# Patient Record
Sex: Male | Born: 1955 | Race: White | Hispanic: No | Marital: Single | Smoking: Former smoker
Health system: Southern US, Community
[De-identification: ages and names within clinical notes are randomized; demographics above are authoritative.]

## PROBLEM LIST (undated history)

## (undated) DIAGNOSIS — I4892 Unspecified atrial flutter: Secondary | ICD-10-CM

## (undated) DIAGNOSIS — E785 Hyperlipidemia, unspecified: Secondary | ICD-10-CM

## (undated) DIAGNOSIS — I2510 Atherosclerotic heart disease of native coronary artery without angina pectoris: Secondary | ICD-10-CM

## (undated) DIAGNOSIS — C801 Malignant (primary) neoplasm, unspecified: Secondary | ICD-10-CM

## (undated) HISTORY — PX: GUM SURGERY: SHX658

---

## 2010-11-16 HISTORY — PX: COLONOSCOPY: SHX174

## 2010-11-16 HISTORY — PX: HERNIA REPAIR: SHX51

## 2011-10-02 ENCOUNTER — Ambulatory Visit: Payer: Self-pay | Admitting: General Surgery

## 2011-10-07 LAB — PATHOLOGY REPORT

## 2011-10-28 ENCOUNTER — Ambulatory Visit: Payer: Self-pay | Admitting: General Surgery

## 2013-06-01 ENCOUNTER — Encounter: Payer: Self-pay | Admitting: *Deleted

## 2017-03-01 ENCOUNTER — Other Ambulatory Visit: Payer: Self-pay | Admitting: Urology

## 2017-03-01 DIAGNOSIS — C61 Malignant neoplasm of prostate: Secondary | ICD-10-CM

## 2017-03-09 ENCOUNTER — Ambulatory Visit
Admission: RE | Admit: 2017-03-09 | Discharge: 2017-03-09 | Disposition: A | Discharge status: Home / Self Care | Payer: BLUE CROSS/BLUE SHIELD | Source: Ambulatory Visit | Attending: Urology | Admitting: Urology

## 2017-03-09 DIAGNOSIS — R937 Abnormal findings on diagnostic imaging of other parts of musculoskeletal system: Secondary | ICD-10-CM | POA: Diagnosis not present

## 2017-03-09 DIAGNOSIS — C61 Malignant neoplasm of prostate: Secondary | ICD-10-CM | POA: Diagnosis not present

## 2017-03-09 DIAGNOSIS — K760 Fatty (change of) liver, not elsewhere classified: Secondary | ICD-10-CM | POA: Diagnosis not present

## 2017-03-09 DIAGNOSIS — K8020 Calculus of gallbladder without cholecystitis without obstruction: Secondary | ICD-10-CM | POA: Diagnosis not present

## 2017-03-09 DIAGNOSIS — I700 Atherosclerosis of aorta: Secondary | ICD-10-CM | POA: Insufficient documentation

## 2017-03-09 DIAGNOSIS — K5730 Diverticulosis of large intestine without perforation or abscess without bleeding: Secondary | ICD-10-CM | POA: Diagnosis not present

## 2017-03-09 MED ORDER — IOPAMIDOL (ISOVUE-300) INJECTION 61%
100.0000 mL | Freq: Once | INTRAVENOUS | Status: AC | PRN
  Administered 2017-03-09: 100 mL via INTRAVENOUS

## 2017-03-09 MED ORDER — TECHNETIUM TC 99M MEDRONATE IV KIT
25.0000 | PACK | Freq: Once | INTRAVENOUS | Status: AC | PRN
  Administered 2017-03-09: 23.96 via INTRAVENOUS

## 2017-03-16 ENCOUNTER — Other Ambulatory Visit: Payer: Self-pay | Admitting: Oncology

## 2017-08-17 ENCOUNTER — Emergency Department: Payer: BLUE CROSS/BLUE SHIELD

## 2017-08-17 ENCOUNTER — Inpatient Hospital Stay
Admission: EM | Admit: 2017-08-17 | Discharge: 2017-08-19 | DRG: 494 | Disposition: A | Discharge status: Home / Self Care | Payer: BLUE CROSS/BLUE SHIELD | Attending: Specialist | Admitting: Specialist

## 2017-08-17 ENCOUNTER — Encounter: Payer: Self-pay | Admitting: Emergency Medicine

## 2017-08-17 DIAGNOSIS — Z923 Personal history of irradiation: Secondary | ICD-10-CM

## 2017-08-17 DIAGNOSIS — Z87891 Personal history of nicotine dependence: Secondary | ICD-10-CM

## 2017-08-17 DIAGNOSIS — S82892A Other fracture of left lower leg, initial encounter for closed fracture: Secondary | ICD-10-CM | POA: Diagnosis not present

## 2017-08-17 DIAGNOSIS — W010XXA Fall on same level from slipping, tripping and stumbling without subsequent striking against object, initial encounter: Secondary | ICD-10-CM | POA: Diagnosis present

## 2017-08-17 DIAGNOSIS — Z791 Long term (current) use of non-steroidal anti-inflammatories (NSAID): Secondary | ICD-10-CM

## 2017-08-17 DIAGNOSIS — C61 Malignant neoplasm of prostate: Secondary | ICD-10-CM | POA: Diagnosis present

## 2017-08-17 DIAGNOSIS — Y92007 Garden or yard of unspecified non-institutional (private) residence as the place of occurrence of the external cause: Secondary | ICD-10-CM

## 2017-08-17 DIAGNOSIS — S82842A Displaced bimalleolar fracture of left lower leg, initial encounter for closed fracture: Principal | ICD-10-CM | POA: Diagnosis present

## 2017-08-17 DIAGNOSIS — Y9301 Activity, walking, marching and hiking: Secondary | ICD-10-CM | POA: Diagnosis present

## 2017-08-17 DIAGNOSIS — S82852A Displaced trimalleolar fracture of left lower leg, initial encounter for closed fracture: Secondary | ICD-10-CM

## 2017-08-17 DIAGNOSIS — Z833 Family history of diabetes mellitus: Secondary | ICD-10-CM

## 2017-08-17 DIAGNOSIS — Z79899 Other long term (current) drug therapy: Secondary | ICD-10-CM

## 2017-08-17 HISTORY — DX: Malignant (primary) neoplasm, unspecified: C80.1

## 2017-08-17 LAB — TYPE AND SCREEN
ABO/RH(D): A POS
Antibody Screen: NEGATIVE

## 2017-08-17 MED ORDER — MORPHINE SULFATE (PF) 4 MG/ML IV SOLN
4.0000 mg | Freq: Once | INTRAVENOUS | Status: AC
  Administered 2017-08-17: 4 mg via INTRAVENOUS

## 2017-08-17 MED ORDER — ONDANSETRON HCL 4 MG/2ML IJ SOLN
4.0000 mg | Freq: Once | INTRAMUSCULAR | Status: AC
  Administered 2017-08-17: 4 mg via INTRAVENOUS

## 2017-08-17 MED ORDER — FENTANYL CITRATE (PF) 100 MCG/2ML IJ SOLN
50.0000 ug | INTRAMUSCULAR | Status: DC | PRN
  Administered 2017-08-18 (×2): 50 ug via INTRAVENOUS
  Filled 2017-08-17 (×3): qty 2

## 2017-08-17 MED ORDER — MORPHINE SULFATE (PF) 4 MG/ML IV SOLN
INTRAVENOUS | Status: AC
  Administered 2017-08-17: 4 mg via INTRAVENOUS
  Filled 2017-08-17: qty 1

## 2017-08-17 MED ORDER — ONDANSETRON HCL 4 MG/2ML IJ SOLN
INTRAMUSCULAR | Status: AC
  Administered 2017-08-17: 4 mg via INTRAVENOUS
  Filled 2017-08-17: qty 2

## 2017-08-17 NOTE — ED Notes (Signed)
ED Provider at bedside. 

## 2017-08-17 NOTE — ED Provider Notes (Addendum)
Patient received in sign-out from Dr. Virgie Dad.  Workup and evaluation pending consultation evaluation and imaging by orthopedics. I spoke with Dr. Sabra Heck of orthopedics who is recommending operative fixation with admission to hospital. Patient will be splinted and admitted the hospital. Remains dynamically stable. Dr. Sabra Heck has requested hospitalist consultation.Marland Kitchen  Marland KitchenSplint Application Date/Time: 61/04/736 11:45 PM Performed by: Merlyn Lot Authorized by: Merlyn Lot   Consent:    Consent obtained:  Verbal   Consent given by:  Patient Procedure details:    Laterality:  Left   Location:  Ankle   Ankle:  L ankle   Splint type:  Short leg   Supplies:  Ortho-Glass Post-procedure details:    Pain:  Unchanged   Patient tolerance of procedure:  Tolerated well, no immediate complications       Merlyn Lot, MD 08/17/17 2344    Merlyn Lot, MD 08/17/17 [phone]

## 2017-08-17 NOTE — ED Triage Notes (Signed)
Patient to ER after slipping on wet grass. States right foot slipped, left foot bent backwards (toes flexed towards body) and patient fell. Patient has large amount of swelling and some deformity to left ankle.

## 2017-08-17 NOTE — ED Provider Notes (Signed)
[facility] Emergency Department Provider Note  Time seen: 10:54 PM  I have reviewed the triage vital signs and the nursing notes.   HISTORY  Chief Complaint Ankle Injury    HPI Stephen Sherman is a 61 y.o. male with a past medical history prostate cancer who presents to the emergency department with left ankle pain. According to the patient he was walking down a slope in his front yard he slipped falling on top of his left leg/ankle. States immediate pain to the ankle unable to bear weight on the leg. Denies any other injuries. Denies hitting his head. Denies LOC. States his pain is moderate but does not wish for any pain medication at this time.  Past Medical History:  Diagnosis Date  . Cancer [facility])    Prostate    There are no active problems to display for this patient.   Past Surgical History:  Procedure Laterality Date  . COLONOSCOPY  2012  . GUM SURGERY  [phone]  . HERNIA REPAIR Bilateral 2012   INGUNIAL    Prior to Admission medications   Medication Sig Start Date End Date Taking? Authorizing Provider  bicalutamide (CASODEX) 50 MG tablet Take 50 mg by mouth daily. 08/10/17  Yes [provider]  ibuprofen (ADVIL,MOTRIN) 200 MG tablet Take 200 mg by mouth every 6 (six) hours as needed.   Yes [provider]  leuprolide (LUPRON) 22.5 MG injection Inject 22.5 mg into the muscle every 3 (three) months.   Yes [provider]  levofloxacin (LEVAQUIN) 500 MG tablet Take 500 mg by mouth daily. 08/09/17  Yes [provider]  tamsulosin (FLOMAX) 0.4 MG CAPS capsule Take 0.4 mg by mouth daily.   Yes [provider]  vitamin C (ASCORBIC ACID) 500 MG tablet Take 500 mg by mouth daily.   Yes [provider]  vitamin E 400 UNIT capsule Take 400 Units by mouth daily.   Yes [provider]    No Known Allergies  No family history on file.  Social History Social History  Substance Use Topics  .  Smoking status: Former Smoker    Packs/day: 1.00    Years: 28.00  . Smokeless tobacco: Never Used  . Alcohol use Yes    Review of Systems Constitutional: Negative for fever.no head injury or LOC. Cardiovascular: Negative for chest pain. Respiratory: Negative for shortness of breath. Gastrointestinal: Negative for abdominal pain. Musculoskeletal: left ankle pain and swelling Neurological: Negative for headache All other ROS negative  ____________________________________________   PHYSICAL EXAM:  VITAL SIGNS: ED Triage Vitals  Enc Vitals Group     BP 08/17/17 2201 120/76     Pulse Rate 08/17/17 2201 78     Resp 08/17/17 2201 20     Temp 08/17/17 2201 98.9 F (37.2 C)     Temp Source 08/17/17 2201 Oral     SpO2 08/17/17 2201 96 %     Weight 08/17/17 2202 272 lb (123.4 kg)     Height 08/17/17 2202 5' 10.5" (1.791 m)     Head Circumference --      Peak Flow --      Pain Score --      Pain Loc --      Pain Edu? --      Excl. in [place]? --     Constitutional: Alert and oriented. Well appearing and in no distress. Eyes: Normal exam ENT   Head: Normocephalic and atraumatic   Mouth/Throat: Mucous membranes are moist.  Cardiovascular: Normal rate, regular rhythm. No murmur Respiratory: Normal respiratory effort without tachypnea nor retractions. Breath sounds are clear  Gastrointestinal: Soft and nontender. No distention.   Musculoskeletal: patient with moderate swelling and deformity to left ankle. Neurovascularly intact distally with 2+ DP pulse normal sensation. Neurologic:  Normal speech and language. No gross focal neurologic deficits  Skin:  Skin is warm, dry and intact.  Psychiatric: Mood and affect are normal.   ____________________________________________   RADIOLOGY   IMPRESSION: Left ankle fractures are better demonstrated on left ankle views. See separate report. Degenerative changes in the intertarsal joints. Focal sclerosis in the inferior posterior  calcaneus is nonspecific but could indicate a nondisplaced impaction fracture.  IMPRESSION: Displaced fracture of the medial and posterior malleolus as well as displaced oblique fracture of the distal fibular diametaphyseal region. Mild anterior subluxation of the tibia on the talus.  ____________________________________________   INITIAL IMPRESSION / ASSESSMENT AND PLAN / ED COURSE  Pertinent labs & imaging results that were available during my care of the patient were reviewed by me and considered in my medical decision making (see chart for details).  patient presents to the emergency department after a fall with left ankle injury. Differential this time would include fracture, dislocation, sprain. X-ray consistent with fracture of medial and posterior malleolus as well as distal fibula fracture. I discussed the patient with Dr. Sabra Heck who will be reviewing the x-rays to decide upon disposition for the patient. Overall the patient appears well, no distress, lying in bed comfortably has long-standing ankle was not being moved. Does not wish for any pain control this time.care everywhere chart reviewed, patient oncology care is currently at [facility].  ____________________________________________   FINAL CLINICAL IMPRESSION(S) / ED DIAGNOSES  ankle fracture    Harvest Dark, MD 08/20/17 7482

## 2017-08-18 ENCOUNTER — Encounter
Admission: EM | Disposition: A | Discharge status: Home / Self Care | Payer: Self-pay | Source: Home / Self Care | Attending: Specialist

## 2017-08-18 ENCOUNTER — Inpatient Hospital Stay: Payer: BLUE CROSS/BLUE SHIELD | Admitting: Certified Registered"

## 2017-08-18 ENCOUNTER — Encounter: Payer: Self-pay | Admitting: Internal Medicine

## 2017-08-18 DIAGNOSIS — Z87891 Personal history of nicotine dependence: Secondary | ICD-10-CM | POA: Diagnosis not present

## 2017-08-18 DIAGNOSIS — Z833 Family history of diabetes mellitus: Secondary | ICD-10-CM | POA: Diagnosis not present

## 2017-08-18 DIAGNOSIS — S82892A Other fracture of left lower leg, initial encounter for closed fracture: Secondary | ICD-10-CM | POA: Diagnosis present

## 2017-08-18 DIAGNOSIS — C61 Malignant neoplasm of prostate: Secondary | ICD-10-CM | POA: Diagnosis present

## 2017-08-18 DIAGNOSIS — Y9301 Activity, walking, marching and hiking: Secondary | ICD-10-CM | POA: Diagnosis present

## 2017-08-18 DIAGNOSIS — S82842A Displaced bimalleolar fracture of left lower leg, initial encounter for closed fracture: Secondary | ICD-10-CM | POA: Diagnosis present

## 2017-08-18 DIAGNOSIS — W010XXA Fall on same level from slipping, tripping and stumbling without subsequent striking against object, initial encounter: Secondary | ICD-10-CM | POA: Diagnosis present

## 2017-08-18 DIAGNOSIS — Z791 Long term (current) use of non-steroidal anti-inflammatories (NSAID): Secondary | ICD-10-CM | POA: Diagnosis not present

## 2017-08-18 DIAGNOSIS — Z79899 Other long term (current) drug therapy: Secondary | ICD-10-CM | POA: Diagnosis not present

## 2017-08-18 DIAGNOSIS — Z923 Personal history of irradiation: Secondary | ICD-10-CM | POA: Diagnosis not present

## 2017-08-18 DIAGNOSIS — Y92007 Garden or yard of unspecified non-institutional (private) residence as the place of occurrence of the external cause: Secondary | ICD-10-CM | POA: Diagnosis not present

## 2017-08-18 HISTORY — PX: ORIF ANKLE FRACTURE: SHX5408

## 2017-08-18 LAB — CBC WITH DIFFERENTIAL/PLATELET
Basophils Absolute: 0 10*3/uL (ref 0–0.1)
Basophils Relative: 0 %
Eosinophils Absolute: 0.1 10*3/uL (ref 0–0.7)
Eosinophils Relative: 2 %
HCT: 38.7 % — ABNORMAL LOW (ref 40.0–52.0)
Hemoglobin: 13.4 g/dL (ref 13.0–18.0)
Lymphocytes Relative: 17 %
Lymphs Abs: 1.5 10*3/uL (ref 1.0–3.6)
MCH: 30.3 pg (ref 26.0–34.0)
MCHC: 34.6 g/dL (ref 32.0–36.0)
MCV: 87.6 fL (ref 80.0–100.0)
Monocytes Absolute: 0.7 10*3/uL (ref 0.2–1.0)
Monocytes Relative: 8 %
Neutro Abs: 6.3 10*3/uL (ref 1.4–6.5)
Neutrophils Relative %: 73 %
Platelets: 164 10*3/uL (ref 150–440)
RBC: 4.42 MIL/uL (ref 4.40–5.90)
RDW: 14.8 % — ABNORMAL HIGH (ref 11.5–14.5)
WBC: 8.6 10*3/uL (ref 3.8–10.6)

## 2017-08-18 LAB — COMPREHENSIVE METABOLIC PANEL
ALT: 51 U/L (ref 17–63)
AST: 51 U/L — ABNORMAL HIGH (ref 15–41)
Albumin: 3.8 g/dL (ref 3.5–5.0)
Alkaline Phosphatase: 45 U/L (ref 38–126)
Anion gap: 8 (ref 5–15)
BUN: 19 mg/dL (ref 6–20)
CO2: 26 mmol/L (ref 22–32)
Calcium: 8.7 mg/dL — ABNORMAL LOW (ref 8.9–10.3)
Chloride: 103 mmol/L (ref 101–111)
Creatinine, Ser: 0.64 mg/dL (ref 0.61–1.24)
GFR calc Af Amer: >60 mL/min (ref >60)
GFR calc non Af Amer: >60 mL/min (ref >60)
Glucose, Bld: 118 mg/dL — ABNORMAL HIGH (ref 65–99)
Potassium: 4.1 mmol/L (ref 3.5–5.1)
Sodium: 137 mmol/L (ref 135–145)
Total Bilirubin: 0.8 mg/dL (ref 0.3–1.2)
Total Protein: 7.1 g/dL (ref 6.5–8.1)

## 2017-08-18 LAB — URINALYSIS, ROUTINE W REFLEX MICROSCOPIC
Bilirubin Urine: NEGATIVE
Glucose, UA: NEGATIVE mg/dL
Hgb urine dipstick: NEGATIVE
Ketones, ur: NEGATIVE mg/dL
Leukocytes, UA: NEGATIVE
Nitrite: NEGATIVE
Protein, ur: NEGATIVE mg/dL
Specific Gravity, Urine: 1.023 (ref 1.005–1.030)
WBC, UA: NONE SEEN WBC/hpf (ref 0–5)
pH: 5 (ref 5.0–8.0)

## 2017-08-18 LAB — MRSA PCR SCREENING: MRSA by PCR: NEGATIVE

## 2017-08-18 LAB — APTT: aPTT: 28 seconds (ref 24–36)

## 2017-08-18 LAB — PROTIME-INR
INR: 1.05
Prothrombin Time: 13.6 seconds (ref 11.4–15.2)

## 2017-08-18 SURGERY — OPEN REDUCTION INTERNAL FIXATION (ORIF) ANKLE FRACTURE
Anesthesia: Spinal | Site: Ankle | Laterality: Left | Wound class: Clean

## 2017-08-18 MED ORDER — BUPIVACAINE HCL (PF) 0.5 % IJ SOLN
INTRAMUSCULAR | Status: AC
  Filled 2017-08-18: qty 30

## 2017-08-18 MED ORDER — BUPIVACAINE HCL 0.5 % IJ SOLN
INTRAMUSCULAR | Status: DC | PRN
  Administered 2017-08-18: 30 mL

## 2017-08-18 MED ORDER — BICALUTAMIDE 50 MG PO TABS
50.0000 mg | ORAL_TABLET | Freq: Every day | ORAL | Status: DC
  Filled 2017-08-18: qty 1

## 2017-08-18 MED ORDER — DEXTROSE 5 % IV SOLN
500.0000 mg | Freq: Four times a day (QID) | INTRAVENOUS | Status: DC | PRN
  Filled 2017-08-18: qty 5

## 2017-08-18 MED ORDER — CALCIUM CARBONATE ANTACID 500 MG PO CHEW
1.0000 | CHEWABLE_TABLET | Freq: Two times a day (BID) | ORAL | Status: DC
  Administered 2017-08-18 – 2017-08-19 (×2): 200 mg via ORAL
  Filled 2017-08-18 (×2): qty 1

## 2017-08-18 MED ORDER — CLINDAMYCIN PHOSPHATE 600 MG/50ML IV SOLN
INTRAVENOUS | Status: DC | PRN
  Administered 2017-08-18: 600 mg via INTRAVENOUS

## 2017-08-18 MED ORDER — EPINEPHRINE PF 1 MG/ML IJ SOLN
INTRAMUSCULAR | Status: AC
  Filled 2017-08-18: qty 1

## 2017-08-18 MED ORDER — BUPIVACAINE HCL (PF) 0.5 % IJ SOLN
INTRAMUSCULAR | Status: DC | PRN
  Administered 2017-08-18: 3 mL via INTRATHECAL

## 2017-08-18 MED ORDER — FENTANYL CITRATE (PF) 100 MCG/2ML IJ SOLN: 25.0000 ug | INTRAMUSCULAR | Status: DC | PRN

## 2017-08-18 MED ORDER — SODIUM CHLORIDE 0.9 % IV SOLN
INTRAVENOUS | Status: DC
  Administered 2017-08-18 (×2): 75 mL/h via INTRAVENOUS

## 2017-08-18 MED ORDER — CHLORHEXIDINE GLUCONATE 4 % EX LIQD
60.0000 mL | Freq: Once | CUTANEOUS | Status: AC
  Administered 2017-08-18: 4 via TOPICAL

## 2017-08-18 MED ORDER — CLINDAMYCIN PHOSPHATE 900 MG/50ML IV SOLN
900.0000 mg | INTRAVENOUS | Status: DC
  Filled 2017-08-18: qty 50

## 2017-08-18 MED ORDER — DEXTROSE 5 % IV SOLN
2.0000 g | Freq: Three times a day (TID) | INTRAVENOUS | Status: AC
  Administered 2017-08-18 – 2017-08-19 (×3): 2 g via INTRAVENOUS
  Filled 2017-08-18 (×5): qty 20

## 2017-08-18 MED ORDER — LIDOCAINE HCL (PF) 2 % IJ SOLN
INTRAMUSCULAR | Status: AC
  Filled 2017-08-18: qty 2

## 2017-08-18 MED ORDER — MORPHINE SULFATE (PF) 2 MG/ML IV SOLN
1.0000 mg | INTRAVENOUS | Status: DC | PRN
  Administered 2017-08-18: 1 mg via INTRAVENOUS
  Filled 2017-08-18 (×2): qty 1

## 2017-08-18 MED ORDER — METOCLOPRAMIDE HCL 10 MG PO TABS: 5.0000 mg | ORAL_TABLET | Freq: Three times a day (TID) | ORAL | Status: DC | PRN

## 2017-08-18 MED ORDER — CLINDAMYCIN PHOSPHATE 600 MG/50ML IV SOLN
600.0000 mg | Freq: Three times a day (TID) | INTRAVENOUS | Status: AC
  Administered 2017-08-18 – 2017-08-19 (×3): 600 mg via INTRAVENOUS
  Filled 2017-08-18 (×4): qty 50

## 2017-08-18 MED ORDER — FENTANYL CITRATE (PF) 100 MCG/2ML IJ SOLN
INTRAMUSCULAR | Status: DC | PRN
  Administered 2017-08-18 (×2): 50 ug via INTRAVENOUS

## 2017-08-18 MED ORDER — CELECOXIB 200 MG PO CAPS
200.0000 mg | ORAL_CAPSULE | Freq: Two times a day (BID) | ORAL | Status: DC
  Administered 2017-08-18 – 2017-08-19 (×2): 200 mg via ORAL
  Filled 2017-08-18 (×2): qty 1

## 2017-08-18 MED ORDER — EPINEPHRINE PF 1 MG/ML IJ SOLN
INTRAMUSCULAR | Status: DC | PRN
  Administered 2017-08-18: .001 mL via INTRATHECAL

## 2017-08-18 MED ORDER — LEVOFLOXACIN 500 MG PO TABS: 500.0000 mg | ORAL_TABLET | Freq: Every day | ORAL | Status: DC

## 2017-08-18 MED ORDER — SODIUM CHLORIDE 0.45 % IV SOLN
INTRAVENOUS | Status: DC
  Administered 2017-08-18: 17:00:00 via INTRAVENOUS

## 2017-08-18 MED ORDER — MORPHINE SULFATE (PF) 2 MG/ML IV SOLN
2.0000 mg | INTRAVENOUS | Status: DC | PRN
  Administered 2017-08-18 (×2): 2 mg via INTRAVENOUS
  Filled 2017-08-18 (×2): qty 1

## 2017-08-18 MED ORDER — ACETAMINOPHEN 325 MG PO TABS
650.0000 mg | ORAL_TABLET | Freq: Four times a day (QID) | ORAL | Status: DC | PRN
  Administered 2017-08-18 – 2017-08-19 (×2): 650 mg via ORAL
  Filled 2017-08-18 (×2): qty 2

## 2017-08-18 MED ORDER — FLEET ENEMA 7-19 GM/118ML RE ENEM: 1.0000 | ENEMA | Freq: Once | RECTAL | Status: DC | PRN

## 2017-08-18 MED ORDER — ACETAMINOPHEN 650 MG RE SUPP: 650.0000 mg | Freq: Four times a day (QID) | RECTAL | Status: DC | PRN

## 2017-08-18 MED ORDER — GLYCOPYRROLATE 0.2 MG/ML IJ SOLN
INTRAMUSCULAR | Status: AC
  Filled 2017-08-18: qty 1

## 2017-08-18 MED ORDER — NEOMYCIN-POLYMYXIN B GU 40-200000 IR SOLN
Status: AC
  Filled 2017-08-18: qty 4

## 2017-08-18 MED ORDER — BISACODYL 10 MG RE SUPP: 10.0000 mg | Freq: Every day | RECTAL | Status: DC | PRN

## 2017-08-18 MED ORDER — SENNA 8.6 MG PO TABS
1.0000 | ORAL_TABLET | Freq: Two times a day (BID) | ORAL | Status: DC
  Administered 2017-08-18 – 2017-08-19 (×2): 8.6 mg via ORAL
  Filled 2017-08-18 (×2): qty 1

## 2017-08-18 MED ORDER — DEXTROSE 5 % IV SOLN
3.0000 g | INTRAVENOUS | Status: AC
  Administered 2017-08-18: 3 g via INTRAVENOUS
  Filled 2017-08-18: qty 1000

## 2017-08-18 MED ORDER — PROPOFOL 500 MG/50ML IV EMUL
INTRAVENOUS | Status: DC | PRN
  Administered 2017-08-18: 25 ug/kg/min via INTRAVENOUS

## 2017-08-18 MED ORDER — HYDROCODONE-ACETAMINOPHEN 7.5-325 MG PO TABS
1.0000 | ORAL_TABLET | ORAL | Status: DC | PRN
  Administered 2017-08-18 – 2017-08-19 (×3): 1 via ORAL
  Administered 2017-08-19: 2 via ORAL
  Filled 2017-08-18 (×2): qty 1
  Filled 2017-08-18: qty 2
  Filled 2017-08-18 (×2): qty 1

## 2017-08-18 MED ORDER — PROPOFOL 10 MG/ML IV BOLUS
INTRAVENOUS | Status: AC
  Filled 2017-08-18: qty 20

## 2017-08-18 MED ORDER — BUPIVACAINE HCL (PF) 0.5 % IJ SOLN
INTRAMUSCULAR | Status: AC
  Filled 2017-08-18: qty 10

## 2017-08-18 MED ORDER — LIDOCAINE HCL (PF) 2 % IJ SOLN
INTRAMUSCULAR | Status: DC | PRN
  Administered 2017-08-18: 40 mg

## 2017-08-18 MED ORDER — TAMSULOSIN HCL 0.4 MG PO CAPS: 0.4000 mg | ORAL_CAPSULE | Freq: Every day | ORAL | Status: DC

## 2017-08-18 MED ORDER — LEUPROLIDE ACETATE (3 MONTH) 22.5 MG IM KIT: 22.5000 mg | PACK | INTRAMUSCULAR | Status: DC

## 2017-08-18 MED ORDER — MIDAZOLAM HCL 5 MG/5ML IJ SOLN
INTRAMUSCULAR | Status: DC | PRN
  Administered 2017-08-18: 2 mg via INTRAVENOUS

## 2017-08-18 MED ORDER — ONDANSETRON HCL 4 MG PO TABS: 4.0000 mg | ORAL_TABLET | Freq: Four times a day (QID) | ORAL | Status: DC | PRN

## 2017-08-18 MED ORDER — ONDANSETRON HCL 4 MG/2ML IJ SOLN
4.0000 mg | Freq: Once | INTRAMUSCULAR | Status: DC | PRN
Start: 2017-08-18 — End: 2017-08-18

## 2017-08-18 MED ORDER — ONDANSETRON HCL 4 MG/2ML IJ SOLN: 4.0000 mg | Freq: Four times a day (QID) | INTRAMUSCULAR | Status: DC | PRN

## 2017-08-18 MED ORDER — EPHEDRINE SULFATE 50 MG/ML IJ SOLN
INTRAMUSCULAR | Status: DC | PRN
  Administered 2017-08-18 (×3): 10 mg via INTRAVENOUS

## 2017-08-18 MED ORDER — FENTANYL CITRATE (PF) 100 MCG/2ML IJ SOLN
INTRAMUSCULAR | Status: AC
  Filled 2017-08-18: qty 2

## 2017-08-18 MED ORDER — METHOCARBAMOL 500 MG PO TABS
500.0000 mg | ORAL_TABLET | Freq: Four times a day (QID) | ORAL | Status: DC | PRN
Start: 2017-08-18 — End: 2017-08-19
  Administered 2017-08-18: 500 mg via ORAL
  Filled 2017-08-18: qty 1

## 2017-08-18 MED ORDER — METOCLOPRAMIDE HCL 5 MG/ML IJ SOLN: 5.0000 mg | Freq: Three times a day (TID) | INTRAMUSCULAR | Status: DC | PRN

## 2017-08-18 MED ORDER — MIDAZOLAM HCL 2 MG/2ML IJ SOLN
INTRAMUSCULAR | Status: AC
  Filled 2017-08-18: qty 2

## 2017-08-18 MED ORDER — NEOMYCIN-POLYMYXIN B GU 40-200000 IR SOLN
Status: DC | PRN
  Administered 2017-08-18: 4 mL

## 2017-08-18 MED ORDER — KETAMINE HCL 50 MG/ML IJ SOLN
INTRAMUSCULAR | Status: DC | PRN
  Administered 2017-08-18: 50 mg via INTRAVENOUS

## 2017-08-18 MED ORDER — ENOXAPARIN SODIUM 30 MG/0.3ML ~~LOC~~ SOLN
30.0000 mg | SUBCUTANEOUS | Status: DC
  Administered 2017-08-19: 30 mg via SUBCUTANEOUS
  Filled 2017-08-18: qty 0.3

## 2017-08-18 MED ORDER — GLYCOPYRROLATE 0.2 MG/ML IJ SOLN
INTRAMUSCULAR | Status: DC | PRN
  Administered 2017-08-18: 0.2 mg via INTRAVENOUS

## 2017-08-18 MED ORDER — PHENYLEPHRINE HCL 10 MG/ML IJ SOLN
INTRAMUSCULAR | Status: DC | PRN
  Administered 2017-08-18 (×7): 100 ug via INTRAVENOUS

## 2017-08-18 MED ORDER — MAGNESIUM HYDROXIDE 400 MG/5ML PO SUSP: 30.0000 mL | Freq: Every day | ORAL | Status: DC | PRN

## 2017-08-18 SURGICAL SUPPLY — 49 items
BIT DRILL 2.5X110 QC LCP DISP (BIT) ×3 IMPLANT
BIT DRILL CANN 2.7X625 NONSTRL (BIT) ×3 IMPLANT
BLADE SURG SZ10 CARB STEEL (BLADE) ×3 IMPLANT
BNDG COHESIVE 4X5 TAN STRL (GAUZE/BANDAGES/DRESSINGS) ×3 IMPLANT
BNDG ESMARK 6X12 TAN STRL LF (GAUZE/BANDAGES/DRESSINGS) ×3 IMPLANT
CANISTER SUCT 1200ML W/VALVE (MISCELLANEOUS) ×3 IMPLANT
CHLORAPREP W/TINT 26ML (MISCELLANEOUS) ×6 IMPLANT
CUFF TOURN 24 STER (MISCELLANEOUS) IMPLANT
CUFF TOURN 30 STER DUAL PORT (MISCELLANEOUS) ×3 IMPLANT
DRAPE FLUOR MINI C-ARM 54X84 (DRAPES) ×3 IMPLANT
ELECT REM PT RETURN 9FT ADLT (ELECTROSURGICAL) ×3
ELECTRODE REM PT RTRN 9FT ADLT (ELECTROSURGICAL) ×1 IMPLANT
GAUZE PETRO XEROFOAM 1X8 (MISCELLANEOUS) ×3 IMPLANT
GAUZE SPONGE 4X4 12PLY STRL (GAUZE/BANDAGES/DRESSINGS) ×3 IMPLANT
GLOVE SURG ORTHO 8.0 STRL STRW (GLOVE) ×3 IMPLANT
GOWN STRL REUS W/ TWL LRG LVL3 (GOWN DISPOSABLE) ×1 IMPLANT
GOWN STRL REUS W/TWL LRG LVL3 (GOWN DISPOSABLE) ×2
GOWN STRL REUS W/TWL LRG LVL4 (GOWN DISPOSABLE) ×3 IMPLANT
GUIDEWARE NON THREAD 1.25X150 (WIRE) ×6
GUIDEWIRE NON THREAD 1.25X150 (WIRE) ×2 IMPLANT
HANDLE YANKAUER SUCT BULB TIP (MISCELLANEOUS) ×3 IMPLANT
KIT RM TURNOVER STRD PROC AR (KITS) ×3 IMPLANT
LABEL OR SOLS (LABEL) ×3 IMPLANT
NEEDLE SPNL 20GX3.5 QUINCKE YW (NEEDLE) ×3 IMPLANT
NS IRRIG 1000ML POUR BTL (IV SOLUTION) ×3 IMPLANT
PACK EXTREMITY ARMC (MISCELLANEOUS) ×3 IMPLANT
PAD PREP 24X41 OB/GYN DISP (PERSONAL CARE ITEMS) ×3 IMPLANT
PENCIL ELECTRO HAND CTR (MISCELLANEOUS) ×3 IMPLANT
PLATE LCP 3.5 1/3 TUB 8HX93 (Plate) ×3 IMPLANT
SCREW CANC FT 4.0X20 (Screw) ×3 IMPLANT
SCREW CANN L THRD/50 4.0 (Screw) ×6 IMPLANT
SCREW CORTEX 3.5 14MM (Screw) ×4 IMPLANT
SCREW CORTEX 3.5 16MM (Screw) ×6 IMPLANT
SCREW CORTEX 3.5 20MM (Screw) ×2 IMPLANT
SCREW CORTEX 3.5 22MM (Screw) ×2 IMPLANT
SCREW LOCK CORT ST 3.5X14 (Screw) ×2 IMPLANT
SCREW LOCK CORT ST 3.5X16 (Screw) ×3 IMPLANT
SCREW LOCK CORT ST 3.5X20 (Screw) ×1 IMPLANT
SCREW LOCK CORT ST 3.5X22 (Screw) ×1 IMPLANT
SPLINT CAST 1 STEP 5X30 WHT (MISCELLANEOUS) ×3 IMPLANT
SPONGE LAP 18X18 5 PK (GAUZE/BANDAGES/DRESSINGS) IMPLANT
STAPLER SKIN PROX 35W (STAPLE) ×3 IMPLANT
STOCKINETTE BIAS CUT 6 980064 (GAUZE/BANDAGES/DRESSINGS) ×3 IMPLANT
STOCKINETTE STRL 6IN 960660 (GAUZE/BANDAGES/DRESSINGS) ×3 IMPLANT
SUT VIC AB 2-0 CT1 27 (SUTURE) ×2
SUT VIC AB 2-0 CT1 TAPERPNT 27 (SUTURE) ×1 IMPLANT
SUT VIC AB 3-0 SH 27 (SUTURE) ×2
SUT VIC AB 3-0 SH 27X BRD (SUTURE) ×1 IMPLANT
WASHER 7MM DIA (Washer) ×6 IMPLANT

## 2017-08-18 NOTE — Anesthesia Procedure Notes (Signed)
Spinal  Patient location during procedure: OR Staffing Anesthesiologist: Alvin Critchley Resident/CRNA: Rolla Plate Performed: resident/CRNA  Preanesthetic Checklist Completed: patient identified, site marked, surgical consent, pre-op evaluation, timeout performed, IV checked, risks and benefits discussed and monitors and equipment checked Spinal Block Patient position: sitting Prep: ChloraPrep and site prepped and draped Patient monitoring: heart rate, continuous pulse ox, blood pressure and cardiac monitor Approach: midline Location: L4-5 Injection technique: single-shot Needle Needle type: Introducer and Pencan  Needle gauge: 24 G Needle length: 9 cm Additional Notes Negative paresthesia. Negative blood return. Positive free-flowing CSF. Expiration date of kit checked and confirmed. Patient tolerated procedure well, without complications.

## 2017-08-18 NOTE — Consult Note (Signed)
[place] [facility] Consultation  Stephen Sherman JWJ:[id number] DOB: Mar 25, 1956 DOA: 08/17/2017 PCP: [name], MD   Requesting physician: Sabra Heck MD Date of consultation: 08/18/17 Reason for consultation: preop eval  CHIEF COMPLAINT:   Chief Complaint  Patient presents with  . Ankle Injury    HISTORY OF PRESENT ILLNESS: Stephen Sherman  is a 61 y.o. male with a known history of prostate cancer  Who is admitted by orthopedic surgery after ankle injury. Patient states that he was going down a slope when he fell. He denies any dizziness chest pain or shortness of breath. Patient reports that he is pretty active. Denies any dyspnea on exertion  with climbing steps.     PAST MEDICAL HISTORY:   Past Medical History:  Diagnosis Date  . Cancer [facility])    Prostate    PAST SURGICAL HISTORY:  Past Surgical History:  Procedure Laterality Date  . COLONOSCOPY  2012  . GUM SURGERY  [phone]  . HERNIA REPAIR Bilateral 2012   INGUNIAL    SOCIAL HISTORY:  Social History  Substance Use Topics  . Smoking status: Former Smoker    Packs/day: 1.00    Years: 28.00  . Smokeless tobacco: Never Used  . Alcohol use Yes    FAMILY HISTORY:  Family History  Problem Relation Age of Onset  . Diabetes Mother     DRUG ALLERGIES: No Known Allergies  REVIEW OF SYSTEMS:   CONSTITUTIONAL: No fever, fatigue or weakness.  EYES: No blurred or double vision.  EARS, NOSE, AND THROAT: No tinnitus or ear pain.  RESPIRATORY: No cough, shortness of breath, wheezing or hemoptysis.  CARDIOVASCULAR: No chest pain, orthopnea, edema.  GASTROINTESTINAL: No nausea, vomiting, diarrhea or abdominal pain.  GENITOURINARY: No dysuria, hematuria.  ENDOCRINE: No polyuria, nocturia,  HEMATOLOGY: No anemia, easy bruising or bleeding SKIN: No rash or lesion. MUSCULOSKELETAL: left foot pain   NEUROLOGIC: No tingling, numbness, weakness.  PSYCHIATRY: No anxiety or depression.    MEDICATIONS AT HOME:  Prior to Admission medications   Medication Sig Start Date End Date Taking? Authorizing Provider  bicalutamide (CASODEX) 50 MG tablet Take 50 mg by mouth daily. 08/10/17  Yes [provider]  ibuprofen (ADVIL,MOTRIN) 200 MG tablet Take 200 mg by mouth every 6 (six) hours as needed.   Yes [provider]  leuprolide (LUPRON) 22.5 MG injection Inject 22.5 mg into the muscle every 3 (three) months.   Yes [provider]  levofloxacin (LEVAQUIN) 500 MG tablet Take 500 mg by mouth daily. 08/09/17  Yes [provider]  tamsulosin (FLOMAX) 0.4 MG CAPS capsule Take 0.4 mg by mouth daily.   Yes [provider]  vitamin C (ASCORBIC ACID) 500 MG tablet Take 500 mg by mouth daily.   Yes [provider]  vitamin E 400 UNIT capsule Take 400 Units by mouth daily.   Yes [provider]      PHYSICAL EXAMINATION:   VITAL SIGNS: Blood pressure (!) 142/75, pulse 67, temperature 98.6 F (37 C), temperature source Oral, resp. rate 19, height 5' 10.5" (1.791 m), weight 276 lb 1.6 oz (125.2 kg), SpO2 97 %.  GENERAL:  61 y.o.-year-old patient lying in the bed with no acute distress.  EYES: Pupils equal, round, reactive to light and accommodation. No scleral icterus. Extraocular muscles intact.  HEENT: Head atraumatic, normocephalic. Oropharynx and nasopharynx clear.  NECK:  Supple, no jugular venous distention. No thyroid enlargement, no tenderness.  LUNGS: Normal breath  sounds bilaterally, no wheezing, rales,rhonchi or crepitation. No use of accessory muscles of respiration.  CARDIOVASCULAR: S1, S2 normal. No murmurs, rubs, or gallops.  ABDOMEN: Soft, nontender, nondistended. Bowel sounds present. No organomegaly or mass.  EXTREMITIES: No pedal edema, cyanosis, or clubbing.  NEUROLOGIC: Cranial nerves II through XII are intact. Muscle strength 5/5 in all extremities. Sensation intact. Gait not checked.  PSYCHIATRIC: The  patient is alert and oriented x 3.  SKIN: No obvious rash, lesion, or ulcer.   LABORATORY PANEL:   CBC  Recent Labs Lab 08/18/17 0333  WBC 8.6  HGB 13.4  HCT 38.7*  PLT 164  MCV 87.6  MCH 30.3  MCHC 34.6  RDW 14.8*  LYMPHSABS 1.5  MONOABS 0.7  EOSABS 0.1  BASOSABS 0.0   ------------------------------------------------------------------------------------------------------------------  Chemistries   Recent Labs Lab 08/18/17 0333  NA 137  K 4.1  CL 103  CO2 26  GLUCOSE 118*  BUN 19  CREATININE 0.64  CALCIUM 8.7*  AST 51*  ALT 51  ALKPHOS 45  BILITOT 0.8   ------------------------------------------------------------------------------------------------------------------ estimated creatinine clearance is 131.4 mL/min (by C-G formula based on SCr of 0.64 mg/dL). ------------------------------------------------------------------------------------------------------------------ No results for input(s): TSH, T4TOTAL, T3FREE, THYROIDAB in the last 72 hours.  Invalid input(s): FREET3   Coagulation profile  Recent Labs Lab 08/18/17 0333  INR 1.05   ------------------------------------------------------------------------------------------------------------------- No results for input(s): DDIMER in the last 72 hours. -------------------------------------------------------------------------------------------------------------------  Cardiac Enzymes No results for input(s): CKMB, TROPONINI, MYOGLOBIN in the last 168 hours.  Invalid input(s): CK ------------------------------------------------------------------------------------------------------------------ Invalid input(s): POCBNP  ---------------------------------------------------------------------------------------------------------------  Urinalysis    Component Value Date/Time   COLORURINE AMBER (A) 08/18/2017 0310   APPEARANCEUR TURBID (A) 08/18/2017 0310   LABSPEC 1.023 08/18/2017 0310   PHURINE 5.0  08/18/2017 0310   GLUCOSEU NEGATIVE 08/18/2017 0310   HGBUR NEGATIVE 08/18/2017 0310   BILIRUBINUR NEGATIVE 08/18/2017 0310   KETONESUR NEGATIVE 08/18/2017 0310   PROTEINUR NEGATIVE 08/18/2017 0310   NITRITE NEGATIVE 08/18/2017 0310   LEUKOCYTESUR NEGATIVE 08/18/2017 0310     RADIOLOGY: Dg Ankle Complete Left  Result Date: 08/17/2017 CLINICAL DATA:  Fall with left ankle pain. EXAM: LEFT ANKLE COMPLETE - 3+ VIEW COMPARISON:  None. FINDINGS: There is a displaced oblique fracture of the distal fibular diametaphyseal region above the ankle mortise. There is a displaced medial malleolar fracture. Minimally displaced posterior malleolar fracture. Mild widening of the anterior tibiotalar joint space likely due to minimal anterior subluxation of the tibia on the talus. Degenerative changes over the midfoot. IMPRESSION: Displaced fracture of the medial and posterior malleolus as well as displaced oblique fracture of the distal fibular diametaphyseal region. Mild anterior subluxation of the tibia on the talus. Electronically Signed   By: Marin Olp M.D.   On: 08/17/2017 22:21   Dg Foot Complete Left  Result Date: 08/17/2017 CLINICAL DATA:  Slip and fall injury. Pain and swelling to the left ankle. EXAM: LEFT FOOT - COMPLETE 3+ VIEW COMPARISON:  None. FINDINGS: Fractures of the distal left tibia and fibula are better visualized on ankle views. There is mild posterior subluxation of the talus with respect to the tibia. Widening of the anterior tibiotalar joint space. Soft tissue swelling about the left ankle. Mild degenerative changes in the intertarsal joints. Linear sclerosis demonstrated in the posterior inferior calcaneus is nonspecific but could indicate a nondisplaced impacted fracture. No acute fractures demonstrated in the phalanges or metatarsal bones. IMPRESSION: Left ankle fractures are better demonstrated on left ankle views. See separate report. Degenerative changes  in the intertarsal joints.  Focal sclerosis in the inferior posterior calcaneus is nonspecific but could indicate a nondisplaced impaction fracture. Electronically Signed   By: Lucienne Capers M.D.   On: 08/17/2017 22:34    EKG: Orders placed or performed during the hospital encounter of 08/17/17  . EKG 12-Lead  . EKG 12-Lead    IMPRESSION AND PLAN: Patient is a 61 year old male with history of prostate cancer with a left ankle fracture  1. Left ankle fracture: preop evaluation We will obtain EKG Patient's at mild-to-moderate risk of surgical complications no further cardiopulmonary workup needed Okay to proceed to surgery  2. History of prostate cancer outpatient follow-up  3. iscellaneous recommend DVT prophylaxis postop  All the records are reviewed and case discussed with ED provider. Management plans discussed with the patient, family and they are in agreement.  CODE STATUS:    Code Status Orders        Start     Ordered   08/18/17 0233  Full code  Continuous     08/18/17 0232    Code Status History    Date Active Date Inactive Code Status Order ID Comments User Context   This patient has a current code status but no historical code status.    Advance Directive Documentation     Most Recent Value  Type of Advance Directive  Healthcare Power of Attorney  Pre-existing out of facility DNR order (yellow form or pink MOST form)  -  "MOST" Form in Place?  -       TOTAL TIME TAKING CARE OF THIS PATIENT:45 minutes.    Dustin Flock M.D on 08/18/2017 at 9:42 AM  Between 7am to 6pm - Pager - [phone]  After 6pm go to [email or web address] - password [email or web address] [facility]  [facility]  Office  [phone]  CC: Primary care physician; [name], MD

## 2017-08-18 NOTE — ED Notes (Signed)
Patient transported to room 134.

## 2017-08-18 NOTE — H&P (Signed)
PREOPERATIVE H&P  Chief Complaint: left ankle fracture  HPI: Stephen Sherman is a 61 y.o. male who presents for preoperative history and physical with a diagnosis of left ankle fracture. Symptoms are rated as moderate to severe.  He fell going down a embankment last night on what grass.  Evaluated in the emergency room and a trimalleolar fracture subluxation was identified.  Was felt to be unstable.  He was admitted for operative fixation.  Discussed surgical versus nonsurgical treatment with the patient.  He is currently undergoing radiation treatments at [facility] for prostate cancer.  I explained to him that we could proceed with surgery today and get him back on his radiation program in 2-3 days, probably.  Inferior is Dr. Shirlean Mylar preferred to prefer to delay this synovectomy done as well.   He has elected for surgical management.   Past Medical History:  Diagnosis Date  . Cancer [facility])    Prostate   Past Surgical History:  Procedure Laterality Date  . COLONOSCOPY  2012  . GUM SURGERY  [phone]  . HERNIA REPAIR Bilateral 2012   INGUNIAL   Social History   Social History  . Marital status: Single    Spouse name: N/A  . Number of children: N/A  . Years of education: N/A   Social History Main Topics  . Smoking status: Former Smoker    Packs/day: 1.00    Years: 28.00  . Smokeless tobacco: Never Used  . Alcohol use Yes  . Drug use: No  . Sexual activity: Not Asked   Other Topics Concern  . None   Social History Narrative  . None   Family History  Problem Relation Age of Onset  . Diabetes Mother    No Known Allergies Prior to Admission medications   Medication Sig Start Date End Date Taking? Authorizing Provider  bicalutamide (CASODEX) 50 MG tablet Take 50 mg by mouth daily. 08/10/17  Yes [provider]  ibuprofen (ADVIL,MOTRIN) 200 MG tablet Take 200 mg by mouth every 6 (six) hours as needed.   Yes [provider]  leuprolide (LUPRON) 22.5 MG injection Inject  22.5 mg into the muscle every 3 (three) months.   Yes [provider]  levofloxacin (LEVAQUIN) 500 MG tablet Take 500 mg by mouth daily. 08/09/17  Yes [provider]  tamsulosin (FLOMAX) 0.4 MG CAPS capsule Take 0.4 mg by mouth daily.   Yes [provider]  vitamin C (ASCORBIC ACID) 500 MG tablet Take 500 mg by mouth daily.   Yes [provider]  vitamin E 400 UNIT capsule Take 400 Units by mouth daily.   Yes [provider]     Positive ROS: All other systems have been reviewed and were otherwise negative with the exception of those mentioned in the HPI and as above.  Physical Exam: General: Alert, no acute distress Cardiovascular: No pedal edema. Heart is regular and without murmur.  Respiratory: No cyanosis, no use of accessory musculature. Lungs are clear. GI: No organomegaly, abdomen is soft and non-tender Skin: No lesions in the area of chief complaint Neurologic: Sensation intact distally Psychiatric: Patient is competent for consent with normal mood and affect Lymphatic: No axillary or cervical lymphadenopathy  MUSCULOSKELETAL: Left leg is wrapped in a posterior splint and bandage.  Emergency room physician noted that skin was all intact.  Neurovascular status good distally.  Assessment: Trimalleolar left ankle fracture- dislocation  Plan: Plan for Procedure(s): OPEN REDUCTION INTERNAL FIXATION (ORIF) ANKLE FRACTURE  The risks  benefits and alternatives were discussed with the patient including but not limited to the risks of nonoperative treatment, versus surgical intervention including infection, bleeding, nerve injury,  blood clots, cardiopulmonary complications, morbidity, mortality, among others, and they were willing to proceed.   Park Breed, MD [phone]   08/18/2017 10:10 AM

## 2017-08-18 NOTE — Anesthesia Post-op Follow-up Note (Signed)
Anesthesia QCDR form completed.        

## 2017-08-18 NOTE — Progress Notes (Signed)
Patient sent to surgery. Signed consent in chart. NSL IV for transport. IV ABX sent.

## 2017-08-18 NOTE — Op Note (Signed)
08/17/2017 - 08/18/2017  PATIENT:  Stephen Sherman    PRE-OPERATIVE DIAGNOSIS: Displaced bimalleolar left ankle fracture  POST-OPERATIVE DIAGNOSIS:  Same  PROCEDURE:  OPEN REDUCTION INTERNAL FIXATION (ORIF) ANKLE FRACTURE, plate and screws laterally, two cancellus screws medially.  SURGEON:  Park Breed, MD  ANESTHESIA:   General  TOURNIQUET TIME: 64  MIN  PREOPERATIVE INDICATIONS:  Stephen Sherman is a  61 y.o. male with a diagnosis of left ankle fracture who elected for surgical management to minimize the risk for malunion and nonunion and post-traumatic arthritis.    The risks benefits and alternatives were discussed with the patient preoperatively including but not limited to the risks of infection, bleeding, nerve injury, cardiopulmonary complications, the need for revision surgery, the need for hardware removal, among others, and the patient was willing to proceed.  OPERATIVE IMPLANTS: 8 hole one third semitubular Synthes plate, with 2 interfragmentary lag screw2, and two 4.0 mm cannulated screws for the medial malleolus.  OPERATIVE PROCEDURE: The patient was brought to the operating room and placed in the supine position. All bony prominences were padded. General anesthesia was administered. The lower extremity was prepped and draped in the usual sterile fashion. The leg was elevated and exsanguinated and the tourniquet was inflated. Time out was performed.   Incision was made over the distal fibula and the fracture was exposed and reduced anatomically with a clamp.  The sural nerve was identified and protected.  As was a high fracture and a extensile incision was used.  The fibula was reduced with clamps and examined under fluoroscopy.  It was felt to be satisfactory.  2 lag screws were placed.  Fluoroscopy showed the fibula to be reduced satisfactorily.  There was some comminution medially and distally.  I then applied a hole one third semitubular plate plate and secured it proximally with  cortical screws and distally with cortical and cancellus screws.  I used the Fluoroscan to confirm satisfactory reduction and fixation. This wound was irrigated and closed with vicryl sutures and staples.  I then turned my attention to the medial malleolus. Incision was made over the medial malleolus and the fracture exposed and held provisionally with a clamp. 2 guidepins were placed for the 4.0 mm cannulated screws and then confirmation of reduction was made with fluoroscopy. I then placed 2  82mm screws which had satisfactory fixation. Fluoroscopy showed good reduction and hardware placement.   The syndesmosis was stressed using live fluoroscopy and found to be stable.   The medial wound was irrigated, and closed with vicryl and staples. Sponge and needle counts were correct. The wounds were injected with local anesthetic. Sterile gauze was applied followed by a posterior splint. He was awakened and returned to the PACU in stable and satisfactory condition. There were no complications.  Park Breed, MD

## 2017-08-18 NOTE — H&P (Signed)
THE PATIENT WAS SEEN PRIOR TO SURGERY TODAY.  HISTORY, ALLERGIES, HOME MEDICATIONS AND OPERATIVE PROCEDURE WERE REVIEWED. RISKS AND BENEFITS OF SURGERY DISCUSSED WITH PATIENT AGAIN.  NO CHANGES FROM INITIAL HISTORY AND PHYSICAL NOTED.    

## 2017-08-18 NOTE — Clinical Social Work Note (Signed)
Clinical Social Work Assessment  Patient Details  Name: Stephen Sherman MRN: [id number] Date of Birth: 11-15-1956  Date of referral:  08/18/17               Reason for consult:  Facility Placement                Permission sought to share information with:    Permission granted to share information::     Name::        Agency::     Relationship::     Contact Information:     Housing/Transportation Living arrangements for the past 2 months:  Single Family Home Source of Information:  Patient Patient Interpreter Needed:  None Criminal Activity/Legal Involvement Pertinent to Current Situation/Hospitalization:  No - Comment as needed Significant Relationships:  Spouse Lives with:  Spouse Do you feel safe going back to the place where you live?  Yes Need for family participation in patient care:  Yes (Comment)  Care giving concerns:  Patient lives [place] with his wife Remo Lipps.    Social Worker assessment / plan:  Holiday representative (CSW) reviewed chart and noted that patient will have surgery today for an ankle fracture. Patient reported that he lives in [place] with his wife and works full time. Patient reported that he is independent with his ADLs and rides motorcycles. Patient reported that he will D/C home after this hospital stay and his wife and sister in law will assist him. Patient reported that he has lots of support at home. CSW explained that PT will evaluate patient after surgery and make a recommendation of home health or SNF and equipment recommendations. Patient verbalized his understanding. CSW will continue to follow and assist as needed.   Employment status:  [employer] information:  Managed Care PT Recommendations:  Not assessed at this time Information / Referral to community resources:  Other (Comment Required) (Patient prefers home health )  Patient/Family's Response to care:  Patient prefers to go home.   Patient/Family's Understanding of and  Emotional Response to Diagnosis, Current Treatment, and Prognosis:  Patient was pleasant and thanked CSW for visit.   Emotional Assessment Appearance:  Appears stated age Attitude/Demeanor/Rapport:    Affect (typically observed):  Accepting, Adaptable, Pleasant Orientation:  Oriented to Self, Oriented to Place, Oriented to  Time, Oriented to Situation Alcohol / Substance use:  Not Applicable Psych involvement (Current and /or in the community):  No (Comment)  Discharge Needs  Concerns to be addressed:  Discharge Planning Concerns Readmission within the last 30 days:  No Current discharge risk:  Dependent with Mobility Barriers to Discharge:  Continued Medical Work up   [employer], Veronia Beets, LCSW 08/18/2017, 4:22 PM

## 2017-08-18 NOTE — Transfer of Care (Signed)
Immediate Anesthesia Transfer of Care Note  Patient: Stephen Sherman  Procedure(s) Performed: OPEN REDUCTION INTERNAL FIXATION (ORIF) ANKLE FRACTURE (Left Ankle)  Patient Location: PACU  Anesthesia Type:Spinal  Level of Consciousness: awake and alert   Airway & Oxygen Therapy: Patient Spontanous Breathing and Patient connected to face mask oxygen  Post-op Assessment: Report given to RN and Post -op Vital signs reviewed and stable  Post vital signs: Reviewed  Last Vitals:  Vitals:   08/18/17 1020 08/18/17 1239  BP: (!) 142/78   Pulse: 82 (P) 75  Resp: 15 (P) 13  Temp: (!) 36.3 C (P) 36.9 C  SpO2: 98% (P) 99%    Last Pain:  Vitals:   08/18/17 1020  TempSrc: Tympanic  PainSc: 6       Patients Stated Pain Goal: 1 (29/92/42 6834)  Complications: No apparent anesthesia complications

## 2017-08-18 NOTE — Progress Notes (Signed)
Dava, RN from coude catheter team I/O cath.  Pt tolerated well.

## 2017-08-18 NOTE — Anesthesia Preprocedure Evaluation (Addendum)
Anesthesia Evaluation  Patient identified by MRN, date of birth, ID band Patient awake    Reviewed: Allergy & Precautions, NPO status , Patient's Chart, lab work & pertinent test results  Airway Mallampati: II  TM Distance: <3 FB     Dental  (+) Chipped   Pulmonary former smoker,    Pulmonary exam normal        Cardiovascular negative cardio ROS Normal cardiovascular exam     Neuro/Psych negative neurological ROS  negative psych ROS   GI/Hepatic Neg liver ROS, Hernia Hx   Endo/Other  negative endocrine ROS  Renal/GU negative Renal ROS     Musculoskeletal negative musculoskeletal ROS (+)   Abdominal Normal abdominal exam  (+)   Peds negative pediatric ROS (+)  Hematology negative hematology ROS (+)   Anesthesia Other Findings   Reproductive/Obstetrics                            Anesthesia Physical Anesthesia Plan  ASA: II  Anesthesia Plan: Spinal   Post-op Pain Management:    Induction:   PONV Risk Score and Plan:   Airway Management Planned: Nasal Cannula  Additional Equipment:   Intra-op Plan:   Post-operative Plan:   Informed Consent: I have reviewed the patients History and Physical, chart, labs and discussed the procedure including the risks, benefits and alternatives for the proposed anesthesia with the patient or authorized representative who has indicated his/her understanding and acceptance.   Dental advisory given  Plan Discussed with: CRNA and Surgeon  Anesthesia Plan Comments:        Anesthesia Quick Evaluation

## 2017-08-18 NOTE — NC FL2 (Signed)
[place] LEVEL OF CARE SCREENING TOOL     IDENTIFICATION  Patient Name: Stephen Sherman Birthdate: [birth year] Sex: male Admission Date (Current Location): 08/17/2017  [place] and [place] Number:  Engineering geologist and Address:  [facility], [address] [place], [place] [address]      Provider Number: [id number]  Attending Physician Name and Address:  Earnestine Leys, MD  Relative Name and Phone Number:       Current Level of Care: Hospital Recommended Level of Care: [place] Prior Approval Number:    Date Approved/Denied:   PASRR Number:   [id number]  Discharge Plan: SNF    Current Diagnoses: Patient Active Problem List   Diagnosis Date Noted  . Fracture dislocation of left ankle joint, closed, initial encounter 08/18/2017    Orientation RESPIRATION BLADDER Height & Weight     Self, Time, Situation, Place  Normal Continent Weight: 276 lb 1.6 oz (125.2 kg) Height:  5' 10.5" (179.1 cm)  BEHAVIORAL SYMPTOMS/MOOD NEUROLOGICAL BOWEL NUTRITION STATUS      Continent Diet (NPO for surgery)  AMBULATORY STATUS COMMUNICATION OF NEEDS Skin   Extensive Assist Verbally Surgical wounds (Incision: Left Ankle. )                       Personal Care Assistance Level of Assistance  Bathing, Feeding, Dressing Bathing Assistance: Limited assistance Feeding assistance: Independent Dressing Assistance: Limited assistance     Functional Limitations Info  Sight, Hearing, Speech Sight Info: Adequate Hearing Info: Adequate Speech Info: Adequate    SPECIAL CARE FACTORS FREQUENCY  PT (By licensed PT), OT (By licensed OT)     PT Frequency:  (5) OT Frequency:  (5)            Contractures      Additional Factors Info  Code Status, Allergies Code Status Info:  (Full Code. ) Allergies Info:  (No Known Allergies. )           Current Medications (08/18/2017):  This is the current hospital active medication  list Current Facility-Administered Medications  Medication Dose Route Frequency Provider Last Rate Last Dose  . 0.9 %  sodium chloride infusion   Intravenous Continuous Earnestine Leys, MD 75 mL/hr at 08/18/17 0312 75 mL/hr at 08/18/17 0312  . bicalutamide (CASODEX) tablet 50 mg  50 mg Oral Daily Earnestine Leys, MD      . ceFAZolin (ANCEF) 3 g in dextrose 5 % 50 mL IVPB  3 g Intravenous On Call to [place], MD      . clindamycin (CLEOCIN) IVPB 900 mg  900 mg Intravenous On Call to OR Earnestine Leys, MD      . Doug Sou Hold] fentaNYL (SUBLIMAZE) injection 50 mcg  50 mcg Intravenous Q1H PRN Merlyn Lot, MD   50 mcg at 08/18/17 [phone]  . leuprolide (LUPRON) injection 22.5 mg  22.5 mg Intramuscular Q90 days Earnestine Leys, MD      . levofloxacin [facility]) tablet 500 mg  500 mg Oral Daily Earnestine Leys, MD      . morphine 2 MG/ML injection 2 mg  2 mg Intravenous Q1H PRN Earnestine Leys, MD   2 mg at 08/18/17 0919  . tamsulosin (FLOMAX) capsule 0.4 mg  0.4 mg Oral Daily Earnestine Leys, MD         Discharge Medications: Please see discharge summary for a list of discharge medications.  Relevant Imaging Results:  Relevant Lab Results:   Additional  Information  (SSN: [id number])  Shishir Krantz, Veronia Beets, LCSW

## 2017-08-19 ENCOUNTER — Encounter: Payer: Self-pay | Admitting: Specialist

## 2017-08-19 LAB — COMPREHENSIVE METABOLIC PANEL
ALT: 49 U/L (ref 17–63)
AST: 38 U/L (ref 15–41)
Albumin: 3.4 g/dL — ABNORMAL LOW (ref 3.5–5.0)
Alkaline Phosphatase: 40 U/L (ref 38–126)
Anion gap: 7 (ref 5–15)
BUN: 13 mg/dL (ref 6–20)
CO2: 28 mmol/L (ref 22–32)
Calcium: 8.8 mg/dL — ABNORMAL LOW (ref 8.9–10.3)
Chloride: 103 mmol/L (ref 101–111)
Creatinine, Ser: 0.72 mg/dL (ref 0.61–1.24)
GFR calc Af Amer: >60 mL/min (ref >60)
GFR calc non Af Amer: >60 mL/min (ref >60)
Glucose, Bld: 116 mg/dL — ABNORMAL HIGH (ref 65–99)
Potassium: 4.1 mmol/L (ref 3.5–5.1)
Sodium: 138 mmol/L (ref 135–145)
Total Bilirubin: 0.9 mg/dL (ref 0.3–1.2)
Total Protein: 6.9 g/dL (ref 6.5–8.1)

## 2017-08-19 LAB — HIV ANTIBODY (ROUTINE TESTING W REFLEX): HIV Screen 4th Generation wRfx: NONREACTIVE

## 2017-08-19 MED ORDER — HYDROCODONE-ACETAMINOPHEN 7.5-325 MG PO TABS: 1.0000 | ORAL_TABLET | Freq: Four times a day (QID) | ORAL | 0 refills | Status: DC | PRN

## 2017-08-19 MED ORDER — GABAPENTIN 400 MG PO CAPS: 400.0000 mg | ORAL_CAPSULE | Freq: Two times a day (BID) | ORAL | 3 refills | Status: DC

## 2017-08-19 MED ORDER — MELOXICAM 15 MG PO TABS: 15.0000 mg | ORAL_TABLET | Freq: Every day | ORAL | 3 refills | Status: DC

## 2017-08-19 MED ORDER — ASPIRIN EC 325 MG PO TBEC: 325.0000 mg | DELAYED_RELEASE_TABLET | Freq: Two times a day (BID) | ORAL | 0 refills | Status: DC

## 2017-08-19 MED ORDER — ENOXAPARIN SODIUM 40 MG/0.4ML ~~LOC~~ SOLN: 40.0000 mg | SUBCUTANEOUS | Status: DC

## 2017-08-19 NOTE — Progress Notes (Signed)
Physical Therapy Treatment Patient Details Name: Stephen Sherman MRN: [id number] DOB: [birth year] Today's Date: 08/19/2017    History of Present Illness 61 y/o male who fell and suffered R ankle fx and had subsequent ORIF.      PT Comments    Pt again did well with PT.  He did feel very fatigued with the effort of ~100 ft of walking with UEs and R hip feeling tired/weak by the end of the bout.  He was able to maintain NWBing w/o issue and reports he is happy that he will be able to avoid steps and can stay on one floor. Pt showed great strength with some basic LE exercises, overall doing well and safe to go home.    Follow Up Recommendations  DC plan and follow up therapy as arranged by surgeon     Equipment Recommendations   (pt has FWW, ordered scooter to arrive Saturday)    Recommendations for Other Services       Precautions / Restrictions Precautions Precautions: Fall Restrictions LLE Weight Bearing: Non weight bearing    Mobility  Bed Mobility Overal bed mobility: Independent             General bed mobility comments: Again easily gets to EOB w/o issue  Transfers Overall transfer level: Independent Equipment used: Rolling walker (2 wheeled)             General transfer comment: Pt did well with maintaining NWBing and using UEs to safely rise.  No issues, good confidence/control.  Ambulation/Gait Ambulation/Gait assistance: Min guard Ambulation Distance (Feet): 100 Feet Assistive device: Rolling walker (2 wheeled);Crutches       General Gait Details: Pt did fatigue in UEs and R hip with "prolonged" ambulation but did show that he can safely ambulate longer distances.  He does state that he is looking forward to getting the knee scooter to be able to get around better.   Stairs            Wheelchair Mobility    Modified Rankin (Stroke Patients Only)       Balance Overall balance assessment: Modified Independent                                           Cognition Arousal/Alertness: Awake/alert Behavior During Therapy: WFL for tasks assessed/performed Overall Cognitive Status: Within Functional Limits for tasks assessed                                        Exercises General Exercises - Lower Extremity Quad Sets: Strengthening;10 reps Hip ABduction/ADduction: Strengthening;10 reps Straight Leg Raises: Strengthening;10 reps    General Comments        Pertinent Vitals/Pain Pain Score: 4     Home Living                      Prior Function            PT Goals (current goals can now be found in the care plan section) Progress towards PT goals: Progressing toward goals    Frequency    BID      PT Plan Current plan remains appropriate    Co-evaluation              AM-PAC PT "6  Clicks" Daily Activity  Outcome Measure  Difficulty turning over in bed (including adjusting bedclothes, sheets and blankets)?: None Difficulty moving from lying on back to sitting on the side of the bed? : None Difficulty sitting down on and standing up from a chair with arms (e.g., wheelchair, bedside commode, etc,.)?: None Help needed moving to and from a bed to chair (including a wheelchair)?: None Help needed walking in hospital room?: None Help needed climbing 3-5 steps with a railing? : A Little 6 Click Score: 23    End of Session Equipment Utilized During Treatment: Gait belt Activity Tolerance: Patient tolerated treatment well;Patient limited by fatigue Patient left: with call bell/phone within reach;in chair;with nursing/sitter in room Nurse Communication: Mobility status PT Visit Diagnosis: Difficulty in walking, not elsewhere classified (R26.2)     Time: 2158-7276 PT Time Calculation (min) (ACUTE ONLY): 28 min  Charges:  $Gait Training: 8-22 mins $Therapeutic Exercise: 8-22 mins                    G Codes:       Kreg Shropshire, DPT 08/19/2017, 3:39 PM

## 2017-08-19 NOTE — Evaluation (Signed)
Physical Therapy Evaluation Patient Details Name: Stephen Sherman MRN: [id number] DOB: 01-26-1956 Today's Date: 08/19/2017   History of Present Illness  61 y/o male who fell and suffered R ankle fx and had subsequent ORIF.    Clinical Impression  Pt very eager to work with PT and motivated to do all he could.  He was surprised at how quickly he fatigued with crutches and agreed that a walker is the only safe option for him apart from the knee scooter.  Did some longer gait training with walker and multiple times negotiating steps with different UE use configurations. He did do very well with the knee scooter, educated and did multiple bouts of transfer with different AD, strategies and frequent cuing though pt was good with ability to keep weight off L LE.  Pt will be safe to return home, but encouraged him to stay on the ground floor for at least a few days or until he is more confident with the steps.      Follow Up Recommendations DC plan and follow up therapy as arranged by surgeon    Equipment Recommendations   (knee scooter)    Recommendations for Other Services       Precautions / Restrictions Precautions Precautions: Fall Restrictions Weight Bearing Restrictions: Yes LLE Weight Bearing: Non weight bearing      Mobility  Bed Mobility Overal bed mobility: Independent             General bed mobility comments: Pt able to get up to EOB easily and w/o issue  Transfers Overall transfer level: Modified independent Equipment used: Rolling walker (2 wheeled)             General transfer comment: Pt actually able to rise and maintain balance with just single UE holding arm rest, showed ability to maintain NWBing and transfer to/from knee scooter and recliner  Ambulation/Gait Ambulation/Gait assistance: Min guard Ambulation Distance (Feet): 40 Feet Assistive device: Rolling walker (2 wheeled);Crutches       General Gait Details: Attempted crutches, pt too unsteady and  too quickly fatigued for this to be a reasonable/safe option.  Pt was able to ambulate much better with walker but also did fatigue quickly.  Stairs Stairs: Yes Stairs assistance: Min guard Stair Management: One rail Left;With crutches Number of Stairs: 4 General stair comments: Pt did 4 steps X 2, once with b/l rails, once with single crutch/single rail.  Pt was able to safely negotiate the steps but was not as confident as he thought he would be.  Encouraged pt to avoid the other floors in his house too much right now.  Pt very safe and confident with knee scooter, went at least 100 ft w/o issue.   Wheelchair Mobility    Modified Rankin (Stroke Patients Only)       Balance Overall balance assessment: Modified Independent                                           Pertinent Vitals/Pain Pain Assessment: 0-10 Pain Score: 5  Pain Location: increased pain with gravity dependent position    Home Living Family/patient expects to be discharged to:: Private residence Living Arrangements: Spouse/significant other Available Help at Discharge: Family (sister-in-law is going to be helping for a while) Type of Home: House Home Access: Level entry     Home Layout: Able to live on main level  with bedroom/bathroom (split level) Home Equipment: Walker - 2 wheels      Prior Function Level of Independence: Independent         Comments: Pt able to be very active, rides motorcycles, works. etc     Hand Dominance        Extremity/Trunk Assessment   Upper Extremity Assessment Upper Extremity Assessment: Overall WFL for tasks assessed    Lower Extremity Assessment Lower Extremity Assessment: Overall WFL for tasks assessed (except L ankle not tested due to splint)       Communication   Communication: No difficulties  Cognition Arousal/Alertness: Awake/alert Behavior During Therapy: WFL for tasks assessed/performed Overall Cognitive Status: Within Functional  Limits for tasks assessed                                        General Comments      Exercises     Assessment/Plan    PT Assessment Patient needs continued PT services  PT Problem List Decreased strength;Decreased range of motion;Decreased activity tolerance;Decreased balance;Decreased mobility;Decreased coordination;Decreased knowledge of use of DME;Decreased safety awareness;Decreased knowledge of precautions;Pain       PT Treatment Interventions DME instruction;Gait training;Functional mobility training;Stair training;Therapeutic activities;Therapeutic exercise;Balance training;Neuromuscular re-education;Patient/family education    PT Goals (Current goals can be found in the Care Plan section)  Acute Rehab PT Goals Patient Stated Goal: get back to walking, riding his motorcycle PT Goal Formulation: With patient Time For Goal Achievement: 09/02/17 Potential to Achieve Goals: Good    Frequency BID   Barriers to discharge        Co-evaluation               AM-PAC PT "6 Clicks" Daily Activity  Outcome Measure Difficulty turning over in bed (including adjusting bedclothes, sheets and blankets)?: None Difficulty moving from lying on back to sitting on the side of the bed? : None Difficulty sitting down on and standing up from a chair with arms (e.g., wheelchair, bedside commode, etc,.)?: None Help needed moving to and from a bed to chair (including a wheelchair)?: None Help needed walking in hospital room?: A Little Help needed climbing 3-5 steps with a railing? : A Little 6 Click Score: 22    End of Session Equipment Utilized During Treatment: Gait belt Activity Tolerance: Patient tolerated treatment well;Patient limited by fatigue Patient left: with call bell/phone within reach;in chair;with nursing/sitter in room Nurse Communication: Mobility status PT Visit Diagnosis: Difficulty in walking, not elsewhere classified (R26.2)    Time:  2248-2500 PT Time Calculation (min) (ACUTE ONLY): 44 min   Charges:   PT Evaluation $PT Eval Low Complexity: 1 Low PT Treatments $Gait Training: 8-22 mins $Therapeutic Activity: 8-22 mins   PT G Codes:   PT G-Codes **NOT FOR INPATIENT CLASS** Functional Assessment Tool Used: AM-PAC 6 Clicks Basic Mobility Functional Limitation: Mobility: Walking and moving around Mobility: Walking and Moving Around Current Status ([id number]): At least 20 percent but less than 40 percent impaired, limited or restricted Mobility: Walking and Moving Around Goal Status [phone]): At least 1 percent but less than 20 percent impaired, limited or restricted    Kreg Shropshire, DPT 08/19/2017, 11:52 AM

## 2017-08-19 NOTE — Anesthesia Postprocedure Evaluation (Signed)
Anesthesia Post Note  Patient: Stephen Sherman  Procedure(s) Performed: OPEN REDUCTION INTERNAL FIXATION (ORIF) ANKLE FRACTURE (Left Ankle)  Patient location during evaluation: Nursing Unit Anesthesia Type: Spinal Level of consciousness: awake, awake and alert, oriented and patient cooperative Pain management: pain level controlled Vital Signs Assessment: post-procedure vital signs reviewed and stable Respiratory status: spontaneous breathing, nonlabored ventilation and respiratory function stable Cardiovascular status: stable Postop Assessment: no headache and no backache Anesthetic complications: no     Last Vitals:  Vitals:   08/18/17 2352 08/19/17 0421  BP: (!) 118/58 128/74  Pulse: 70 73  Resp: 20   Temp: 37.1 C   SpO2: 96% 95%    Last Pain:  Vitals:   08/19/17 0612  TempSrc:   PainSc: 7                  Sharunda Salmon,  Ermalinda Joubert R

## 2017-08-19 NOTE — Care Management Note (Signed)
Case Management Note  Patient Details  Name: Stephen Sherman MRN: [id number] Date of Birth: 01-12-1956  Subjective/Objective:   Discharging today. Dr. Sabra Heck declined any PT needs. Met with patient at bedside. Patient bought a knee scooter off line. No further needs identified. Case closed.                  Action/Plan:   Expected Discharge Date:  08/19/17               Expected Discharge Plan:  Home/Self Care  In-House Referral:     Discharge planning Services     Post Acute Care Choice:    Choice offered to:     DME Arranged:    DME Agency:     HH Arranged:    HH Agency:     Status of Service:  Completed, signed off  If discussed at [employer] of Stay Meetings, dates discussed:    Additional Comments:  Jolly Mango, RN 08/19/2017, 11:15 AM

## 2017-08-19 NOTE — Progress Notes (Signed)
Stephen Sherman at [facility]                                                                                                                                                                                  Patient Demographics   Stephen Sherman, is a 61 y.o. male, DOB - February 13, 1956, LOV:[id number]  Admit date - 08/17/2017   Admitting Physician Stephen Leys, MD  Outpatient Primary MD for the patient is Stephen Billet, MD   LOS - 1  Subjective: Pt feels well, has some pain in leg    Review of Systems:   CONSTITUTIONAL: No documented fever. No fatigue, weakness. No weight gain, no weight loss.  EYES: No blurry or double vision.  ENT: No tinnitus. No postnasal drip. No redness of the oropharynx.  RESPIRATORY: No cough, no wheeze, no hemoptysis. No dyspnea.  CARDIOVASCULAR: No chest pain. No orthopnea. No palpitations. No syncope.  GASTROINTESTINAL: No nausea, no vomiting or diarrhea. No abdominal pain. No melena or hematochezia.  GENITOURINARY: No dysuria or hematuria.  ENDOCRINE: No polyuria or nocturia. No heat or cold intolerance.  HEMATOLOGY: No anemia. No bruising. No bleeding.  INTEGUMENTARY: No rashes. No lesions.  MUSCULOSKELETAL: No arthritis. No swelling. No gout. + leg pain NEUROLOGIC: No numbness, tingling, or ataxia. No seizure-type activity.  PSYCHIATRIC: No anxiety. No insomnia. No ADD.    Vitals:   Vitals:   08/18/17 2100 08/18/17 2352 08/19/17 0421 08/19/17 0752  BP: 118/60 (!) 118/58 128/74 130/69  Pulse: 69 70 73 69  Resp: 19 20  18   Temp:  98.7 F (37.1 C)  99.4 F (37.4 C)  TempSrc:  Oral  Oral  SpO2: 95% 96% 95% 95%  Weight:      Height:        Wt Readings from Last 3 Encounters:  08/18/17 276 lb (125.2 kg)     Intake/Output Summary (Last 24 hours) at 08/19/17 1101 Last data filed at 08/19/17 0811  Gross per 24 hour  Intake           1532.5 ml  Output             4060 ml  Net          -2527.5 ml    Physical Exam:    GENERAL: Pleasant-appearing in no apparent distress.  HEAD, EYES, EARS, NOSE AND THROAT: Atraumatic, normocephalic. Extraocular muscles are intact. Pupils equal and reactive to light. Sclerae anicteric. No conjunctival injection. No oro-pharyngeal erythema.  NECK: Supple. There is no jugular venous distention. No bruits, no lymphadenopathy, no thyromegaly.  HEART: Regular rate and rhythm,. No murmurs, no rubs, no clicks.  LUNGS: Clear to auscultation  bilaterally. No rales or rhonchi. No wheezes.  ABDOMEN: Soft, flat, nontender, nondistended. Has good bowel sounds. No hepatosplenomegaly appreciated.  EXTREMITIES: No evidence of any cyanosis, clubbing, or peripheral edema.  +2 pedal and radial pulses bilaterally.  NEUROLOGIC: The patient is alert, awake, and oriented x3 with no focal motor or sensory deficits appreciated bilaterally.  SKIN: Moist and warm with no rashes appreciated.  Psych: Not anxious, depressed LN: No inguinal LN enlargement    Antibiotics   Anti-infectives    Start     Dose/Rate Route Frequency Ordered Stop   08/18/17 1600  ceFAZolin (ANCEF) 2 g in dextrose 5 % 100 mL IVPB     2 g 240 mL/hr over 30 Minutes Intravenous Every 8 hours 08/18/17 1550 08/19/17 0630   08/18/17 1600  clindamycin (CLEOCIN) IVPB 600 mg     600 mg 100 mL/hr over 30 Minutes Intravenous Every 8 hours 08/18/17 1550 08/19/17 0544   08/18/17 1000  levofloxacin (LEVAQUIN) tablet 500 mg  Status:  Discontinued     500 mg Oral Daily 08/18/17 0232 08/18/17 1549   08/18/17 0600  ceFAZolin (ANCEF) 3 g in dextrose 5 % 50 mL IVPB     3 g 130 mL/hr over 30 Minutes Intravenous On call to O.R. 08/18/17 0232 08/18/17 1113   08/18/17 0600  clindamycin (CLEOCIN) IVPB 900 mg  Status:  Discontinued     900 mg 100 mL/hr over 30 Minutes Intravenous On call to O.R. 08/18/17 0232 08/18/17 1549      Medications   Scheduled Meds: . calcium carbonate  1 tablet Oral BID  . celecoxib  200 mg Oral Q12H  . [START ON  08/20/2017] enoxaparin (LOVENOX) injection  40 mg Subcutaneous Q24H  . senna  1 tablet Oral BID   Continuous Infusions: . sodium chloride 75 mL/hr at 08/18/17 2252  . methocarbamol (ROBAXIN)  IV     PRN Meds:.acetaminophen **OR** acetaminophen, bisacodyl, HYDROcodone-acetaminophen, magnesium hydroxide, methocarbamol **OR** methocarbamol (ROBAXIN)  IV, metoCLOPramide **OR** metoCLOPramide (REGLAN) injection, morphine injection, ondansetron **OR** ondansetron (ZOFRAN) IV, sodium phosphate   Data Review:   Micro Results Recent Results (from the past 240 hour(s))  MRSA PCR Screening     Status: None   Collection Time: 08/18/17  3:00 AM  Result Value Ref Range Status   MRSA by PCR NEGATIVE NEGATIVE Final    Comment:        The GeneXpert MRSA Assay (FDA approved for NASAL specimens only), is one component of a comprehensive MRSA colonization surveillance program. It is not intended to diagnose MRSA infection nor to guide or monitor treatment for MRSA infections.     Radiology Reports Dg Ankle Complete Left  Result Date: 08/17/2017 CLINICAL DATA:  Fall with left ankle pain. EXAM: LEFT ANKLE COMPLETE - 3+ VIEW COMPARISON:  None. FINDINGS: There is a displaced oblique fracture of the distal fibular diametaphyseal region above the ankle mortise. There is a displaced medial malleolar fracture. Minimally displaced posterior malleolar fracture. Mild widening of the anterior tibiotalar joint space likely due to minimal anterior subluxation of the tibia on the talus. Degenerative changes over the midfoot. IMPRESSION: Displaced fracture of the medial and posterior malleolus as well as displaced oblique fracture of the distal fibular diametaphyseal region. Mild anterior subluxation of the tibia on the talus. Electronically Signed   By: Stephen Sherman M.D.   On: 08/17/2017 22:21   Dg Foot Complete Left  Result Date: 08/17/2017 CLINICAL DATA:  Slip and fall injury. Pain and swelling to  the left  ankle. EXAM: LEFT FOOT - COMPLETE 3+ VIEW COMPARISON:  None. FINDINGS: Fractures of the distal left tibia and fibula are better visualized on ankle views. There is mild posterior subluxation of the talus with respect to the tibia. Widening of the anterior tibiotalar joint space. Soft tissue swelling about the left ankle. Mild degenerative changes in the intertarsal joints. Linear sclerosis demonstrated in the posterior inferior calcaneus is nonspecific but could indicate a nondisplaced impacted fracture. No acute fractures demonstrated in the phalanges or metatarsal bones. IMPRESSION: Left ankle fractures are better demonstrated on left ankle views. See separate report. Degenerative changes in the intertarsal joints. Focal sclerosis in the inferior posterior calcaneus is nonspecific but could indicate a nondisplaced impaction fracture. Electronically Signed   By: Lucienne Capers M.D.   On: 08/17/2017 22:34     CBC  Recent Labs Lab 08/18/17 0333  WBC 8.6  HGB 13.4  HCT 38.7*  PLT 164  MCV 87.6  MCH 30.3  MCHC 34.6  RDW 14.8*  LYMPHSABS 1.5  MONOABS 0.7  EOSABS 0.1  BASOSABS 0.0    Chemistries   Recent Labs Lab 08/18/17 0333 08/19/17 0452  NA 137 138  K 4.1 4.1  CL 103 103  CO2 26 28  GLUCOSE 118* 116*  BUN 19 13  CREATININE 0.64 0.72  CALCIUM 8.7* 8.8*  AST 51* 38  ALT 51 49  ALKPHOS 45 40  BILITOT 0.8 0.9   ------------------------------------------------------------------------------------------------------------------ estimated creatinine clearance is 131.4 mL/min (by C-G formula based on SCr of 0.72 mg/dL). ------------------------------------------------------------------------------------------------------------------ No results for input(s): HGBA1C in the last 72 hours. ------------------------------------------------------------------------------------------------------------------ No results for input(s): CHOL, HDL, LDLCALC, TRIG, CHOLHDL, LDLDIRECT in the last  72 hours. ------------------------------------------------------------------------------------------------------------------ No results for input(s): TSH, T4TOTAL, T3FREE, THYROIDAB in the last 72 hours.  Invalid input(s): FREET3 ------------------------------------------------------------------------------------------------------------------ No results for input(s): VITAMINB12, FOLATE, FERRITIN, TIBC, IRON, RETICCTPCT in the last 72 hours.  Coagulation profile  Recent Labs Lab 08/18/17 0333  INR 1.05    No results for input(s): DDIMER in the last 72 hours.  Cardiac Enzymes No results for input(s): CKMB, TROPONINI, MYOGLOBIN in the last 168 hours.  Invalid input(s): CK ------------------------------------------------------------------------------------------------------------------ Invalid input(s): [place]  Pt is 61 y.o with ankle fx  1. H/o prostate cancer- out pt radiation therapy  2. Left ankle fx doing well   3. Ok to [employer] from medical standpoint   Code Status History    Date Active Date Inactive Code Status Order ID Comments User Context   08/18/2017  2:32 AM 08/18/2017  3:49 PM Full Code [id number]  Stephen Leys, MD Inpatient    Advance Directive Documentation     Most Recent Value  Type of Advance Directive  Healthcare Power of Attorney  Pre-existing out of facility DNR order (yellow form or pink MOST form)  -  "MOST" Form in Place?  -            DVT Prophylaxis  Lovenox   Lab Results  Component Value Date   PLT 164 08/18/2017     Time Spent in minutes  27min Greater than 50% of time spent in care coordination and counseling patient regarding the condition and plan of care.   Dustin Flock M.D on 08/19/2017 at 11:01 AM  Between 7am to 6pm - Pager - [phone]  After 6pm go to [email or web address] - password [email or web address] [facility]   Office  [phone]

## 2017-08-19 NOTE — Progress Notes (Signed)
Subjective: Doing fairly well.  Started PT. Wants to go home later today.   1 Day Post-Op Procedure(s) (LRB): OPEN REDUCTION INTERNAL FIXATION (ORIF) ANKLE FRACTURE (Left)    Patient reports pain as mild.  Objective:   VITALS:   Vitals:   08/19/17 0421 08/19/17 0752  BP: 128/74 130/69  Pulse: 73 69  Resp:  18  Temp:  99.4 F (37.4 C)  SpO2: 95% 95%    Neurologically intact ABD soft Neurovascular intact Sensation intact distally Intact pulses distally Dorsiflexion/Plantar flexion intact Incision: scant drainage  LABS  Recent Labs  08/18/17 0333  HGB 13.4  HCT 38.7*  WBC 8.6  PLT 164     Recent Labs  08/18/17 0333 08/19/17 0452  NA 137 138  K 4.1 4.1  BUN 19 13  CREATININE 0.64 0.72  GLUCOSE 118* 116*     Recent Labs  08/18/17 0333  INR 1.05     Assessment/Plan: 1 Day Post-Op Procedure(s) (LRB): OPEN REDUCTION INTERNAL FIXATION (ORIF) ANKLE FRACTURE (Left)   Advance diet Up with therapy D/C IV fluids   Home after PT this afternoon.  RTC Monday

## 2017-08-19 NOTE — Progress Notes (Signed)
Per PT and MD patient will D/C home today. RN case manager aware of above. Please reconsult if future social work needs arise. CSW signing off.   [employer], LCSW [phone]

## 2017-08-19 NOTE — Discharge Summary (Signed)
Physician Discharge Summary  Patient ID: Stephen Sherman MRN: [id number] DOB/AGE: 1955-12-06 61 y.o.  Admit date: 08/17/2017 Discharge date: 08/19/2017  Admission Diagnoses: Left bimalleolar ankle fracture dislocation  Discharge Diagnoses: same  Active Problems:   Fracture dislocation of left ankle joint, closed, initial encounter   Discharged Condition: good  Hospital Course: Surgery 08/18/17 for ankle fracture went well.  Doing well today on POD #1.  Mild pain.  Walked with PT.  Will D/C this afternoon after PT.  RTC   Monday for cast  Consults: Hospitalist  Significant Diagnostic Studies: radiology: X-Ray: good position of fracture after surgery  Treatments: antibiotics: kefzol and cleocin  Discharge Exam: Blood pressure 130/69, pulse 69, temperature 99.4 F (37.4 C), temperature source Oral, resp. rate 18, height 5' 10.5" (1.791 m), weight 125.2 kg (276 lb), SpO2 95 %. csm good left leg.  Mild drainage.    Disposition: Final discharge disposition not confirmed  Discharge Instructions    Call MD for:  persistant nausea and vomiting    Complete by:  As directed    Call MD for:  redness, tenderness, or signs of infection (pain, swelling, redness, odor or green/yellow discharge around incision site)    Complete by:  As directed    Call MD for:  severe uncontrolled pain    Complete by:  As directed    Call MD for:  temperature >100.4    Complete by:  As directed    Diet - low sodium heart healthy    Complete by:  As directed    Discharge instructions    Complete by:  As directed    Elevate operative leg on 2-3 pillows Apply ice as needed RTC as appointed   Increase activity slowly    Complete by:  As directed    No weight on left leg Elevate leg on several pillows RTC Monday AM for cast   Leave dressing on - Keep it clean, dry, and intact until clinic visit    Complete by:  As directed      Allergies as of 08/19/2017   No Known Allergies     Medication List     TAKE these medications   aspirin EC 325 MG tablet Take 1 tablet (325 mg total) by mouth 2 (two) times daily.   bicalutamide 50 MG tablet Commonly known as:  CASODEX Take 50 mg by mouth daily.   gabapentin 400 MG capsule Commonly known as:  NEURONTIN Take 1 capsule (400 mg total) by mouth 2 (two) times daily.   HYDROcodone-acetaminophen 7.5-325 MG tablet Commonly known as:  NORCO Take 1 tablet by mouth every 6 (six) hours as needed for moderate pain.   ibuprofen 200 MG tablet Commonly known as:  ADVIL,MOTRIN Take 200 mg by mouth every 6 (six) hours as needed.   leuprolide 22.5 MG injection Commonly known as:  LUPRON Inject 22.5 mg into the muscle every 3 (three) months.   levofloxacin 500 MG tablet Commonly known as:  LEVAQUIN Take 500 mg by mouth daily.   meloxicam 15 MG tablet Commonly known as:  MOBIC Take 1 tablet (15 mg total) by mouth daily.   tamsulosin 0.4 MG Caps capsule Commonly known as:  FLOMAX Take 0.4 mg by mouth daily.   vitamin C 500 MG tablet Commonly known as:  ASCORBIC ACID Take 500 mg by mouth daily.   vitamin E 400 UNIT capsule Take 400 Units by mouth daily.            Durable  Medical Equipment        Start     Ordered   08/19/17 1101  DME 3-in-1  Once    Comments:  Foot surgery   08/19/17 1102   08/18/17 1551  DME Walker rolling  Once    Question:  Patient needs a walker to treat with the following condition  Answer:  Fracture dislocation of left ankle joint, closed, initial encounter   08/18/17 1550     Follow-up Information    Earnestine Leys, MD Follow up in 4 day(s).   Specialty:  Specialist Contact information: [place] [address] [phone]           Signed: Park Breed 08/19/2017, 11:04 AM

## 2017-08-19 NOTE — Progress Notes (Signed)
lovenox dose increased to 40mg  daily due to crcl >30 ml/min and body weight >45kg  Isiaah Cuervo D Kiosha Buchan, Pharm.D, BCPS Clinical Pharmacist

## 2017-08-19 NOTE — Progress Notes (Signed)
Pt discharged to home via wheelchair with family without incident per MD order. All discharge teachings done both written and verbal and pt verbalizes understanding of all teachings. Pt discharged with prescriptions for neurontin, hydrocodone, aspirin and mobic. Pt has follow up appointment with Dr. Sabra Heck. No change in pt from AM assessment. Pt pain controlled on discharge.

## 2017-09-14 ENCOUNTER — Encounter: Payer: Self-pay | Admitting: Urology

## 2017-09-15 ENCOUNTER — Encounter: Payer: Self-pay | Admitting: Urology

## 2018-01-07 ENCOUNTER — Other Ambulatory Visit: Payer: Self-pay | Admitting: Orthopedic Surgery

## 2018-01-07 DIAGNOSIS — M25572 Pain in left ankle and joints of left foot: Secondary | ICD-10-CM

## 2018-01-13 ENCOUNTER — Ambulatory Visit
Admission: RE | Admit: 2018-01-13 | Discharge: 2018-01-13 | Disposition: A | Discharge status: Home / Self Care | Payer: BLUE CROSS/BLUE SHIELD | Source: Ambulatory Visit | Attending: Orthopedic Surgery | Admitting: Orthopedic Surgery

## 2018-01-13 DIAGNOSIS — M25572 Pain in left ankle and joints of left foot: Secondary | ICD-10-CM

## 2018-01-24 ENCOUNTER — Encounter (INDEPENDENT_AMBULATORY_CARE_PROVIDER_SITE_OTHER): Payer: Self-pay | Admitting: Orthopedic Surgery

## 2018-01-24 ENCOUNTER — Ambulatory Visit (INDEPENDENT_AMBULATORY_CARE_PROVIDER_SITE_OTHER): Payer: BLUE CROSS/BLUE SHIELD | Admitting: Orthopedic Surgery

## 2018-01-24 VITALS — Ht 70.5 in | Wt 276.0 lb

## 2018-01-24 DIAGNOSIS — M19172 Post-traumatic osteoarthritis, left ankle and foot: Secondary | ICD-10-CM

## 2018-01-24 NOTE — Progress Notes (Signed)
Office Visit Note   Patient: Stephen Sherman           Date of Birth: Jun 26, 1956           MRN: [id number] Visit Date: 01/24/2018              Requested by: [name], MD [address]   [place], [place] [address] PCP: [name], MD  Chief Complaint  Patient presents with  . Left Ankle - Pain    S/p ORIF left ankle fracture - Dr. Earnestine Leys.      HPI: Patient is a 62 year old gentleman who presents status post open reduction internal fixation bimalleolar left ankle fracture dislocation.  Date of injury 42-18.  Patient states he sustained the injury while under therapy for his prostate cancer.  He has received chemotherapy radiation therapy and seed placement.  Past medical history negative for diabetes negative for tobacco.  Patient states that he has pain with all activities of daily living worse with start up.  Assessment & Plan: Visit Diagnoses:  1. Post-traumatic arthritis of ankle, left     Plan: Discussed with the patient with the extent of the avascular necrosis I do not think there is sufficient bone for a total ankle arthroplasty.  Recommend proceeding with a fusion.  Risks and benefits were discussed including infection and persistent pain and need for additional surgery.  Patient states he understands would like to call when he is ready to schedule surgery.  Discussed that we would do this as an outpatient with arthroscopic fusion.  Follow-Up Instructions: Return if symptoms worsen or fail to improve.   Ortho Exam  Patient is alert, oriented, no adenopathy, well-dressed, normal affect, normal respiratory effort. Examination patient has a good dorsalis pedis pulse with good hair growth.  He has dorsiflexion only to neutral and this is painful.  He has plantar flexion of the tibiotalar joint of only 20 degrees.  He is tender to palpation around the ankle there is no redness no cellulitis no clinical signs of infection.  Review of his CT scan shows advanced  destructive changes through the tibiotalar joint with subchondral cysts which extend into the metaphyseal bone of the tibia.  The subtalar joint is congruent.  The alignment is neutral.  Fibrous union fracture of the fibula.  Weber C.  Imaging: No results found. No images are attached to the encounter.  Labs: No results found for: HGBA1C, ESRSEDRATE, CRP, LABURIC, REPTSTATUS, GRAMSTAIN, CULT, LABORGA  @LABSALLVALUES (HGBA1)@  Body mass index is 39.04 kg/m.  Orders:  No orders of the defined types were placed in this encounter.  No orders of the defined types were placed in this encounter.    Procedures: No procedures performed  Clinical Data: No additional findings.  ROS:  All other systems negative, except as noted in the HPI. Review of Systems  Objective: Vital Signs: Ht 5' 10.5" (1.791 m)   Wt 276 lb (125.2 kg)   BMI 39.04 kg/m   Specialty Comments:  No specialty comments available.  PMFS History: Patient Active Problem List   Diagnosis Date Noted  . Fracture dislocation of left ankle joint, closed, initial encounter 08/18/2017   Past Medical History:  Diagnosis Date  . Cancer [facility])    Prostate    Family History  Problem Relation Age of Onset  . Diabetes Mother     Past Surgical History:  Procedure Laterality Date  . COLONOSCOPY  2012  . GUM SURGERY  [phone]  .  HERNIA REPAIR Bilateral 2012   INGUNIAL  . ORIF ANKLE FRACTURE Left 08/18/2017   Procedure: OPEN REDUCTION INTERNAL FIXATION (ORIF) ANKLE FRACTURE;  Surgeon: Earnestine Leys, MD;  Location: [facility] ORS;  Service: Orthopedics;  Laterality: Left;   Social History   Occupational History  . Not on file  Tobacco Use  . Smoking status: Former Smoker    Packs/day: 1.00    Years: 28.00    Pack years: 28.00    Last attempt to quit: 12/17/2003    Years since quitting: 14.1  . Smokeless tobacco: Never Used  Substance and Sexual Activity  . Alcohol use: Yes    Comment: 2+ beers a day.  . Drug  use: No  . Sexual activity: Not on file

## 2018-08-12 ENCOUNTER — Other Ambulatory Visit: Payer: Self-pay | Admitting: Student

## 2018-08-12 DIAGNOSIS — M5412 Radiculopathy, cervical region: Secondary | ICD-10-CM

## 2018-08-12 DIAGNOSIS — M5416 Radiculopathy, lumbar region: Secondary | ICD-10-CM

## 2018-08-27 ENCOUNTER — Other Ambulatory Visit: Payer: BLUE CROSS/BLUE SHIELD

## 2018-08-27 ENCOUNTER — Ambulatory Visit: Payer: BLUE CROSS/BLUE SHIELD

## 2019-03-12 IMAGING — CT CT ABD-PEL WO/W CM
2 of 5 series · 13 of 32 positions shown, 18 images · IV contrast (iopamidol)
Comparison: None.

CLINICAL DATA: Newly diagnosed prostate carcinoma.

EXAM:
CT ABDOMEN AND PELVIS WITHOUT AND WITH CONTRAST
TECHNIQUE: Multidetector CT imaging of the abdomen and pelvis was performed
following the standard protocol before and following the bolus
administration of intravenous contrast.
CONTRAST:  100mL [id number] IOPAMIDOL (TVLZ1V-A66) INJECTION 61%

[Series 2: axial st · axial · 0.94mm/px · z∈[-1009,-584]mm · 8 of 111 slices shown, 13 images (1 of 2)]
[im 13/111  soft-tissue]
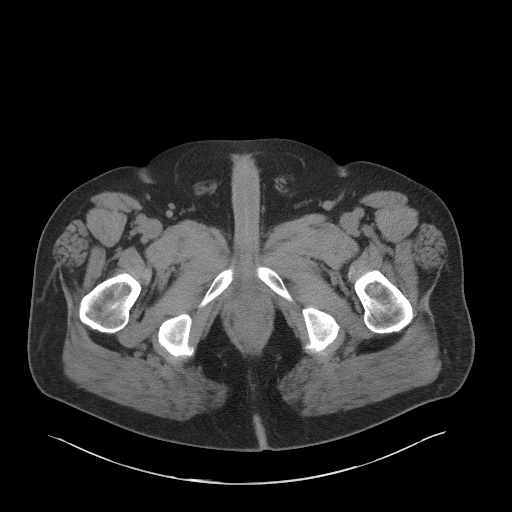
[im 13/111  bone]
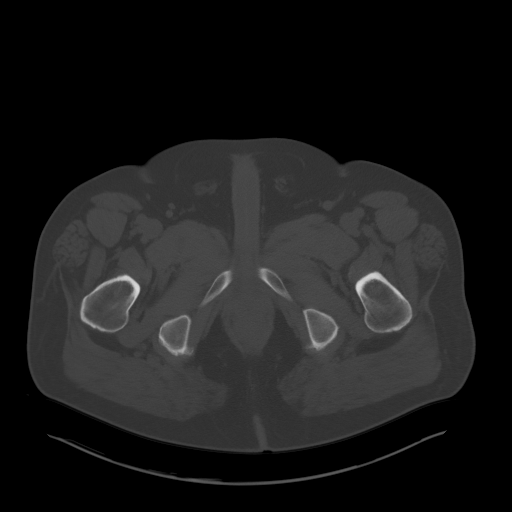
[im 25/111  soft-tissue]
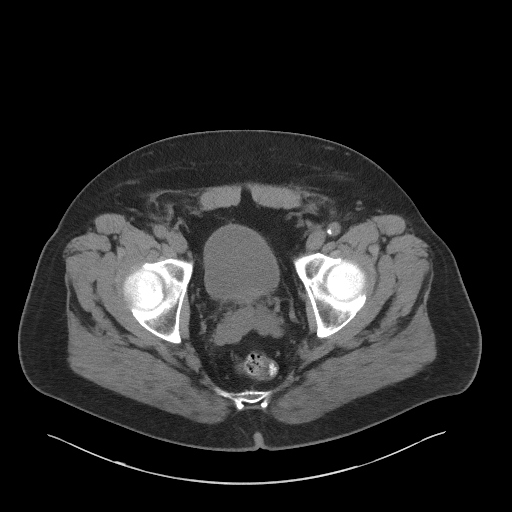
[im 37/111  soft-tissue]
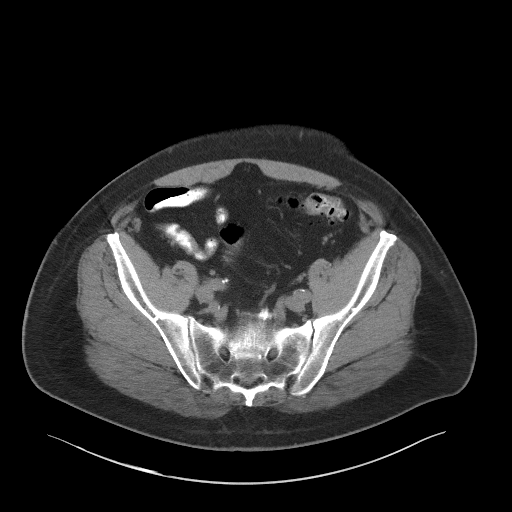
[im 49/111  soft-tissue]
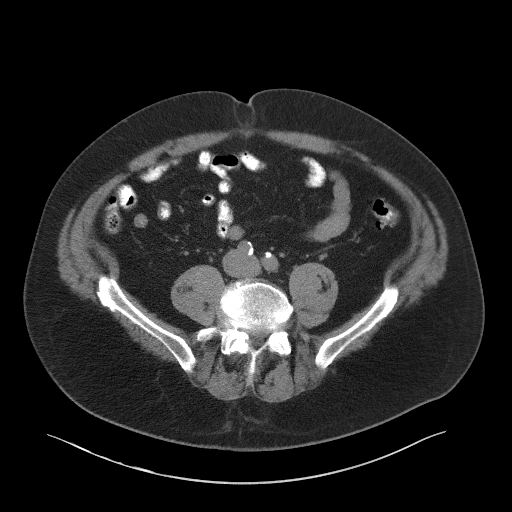
[im 62/111  soft-tissue]
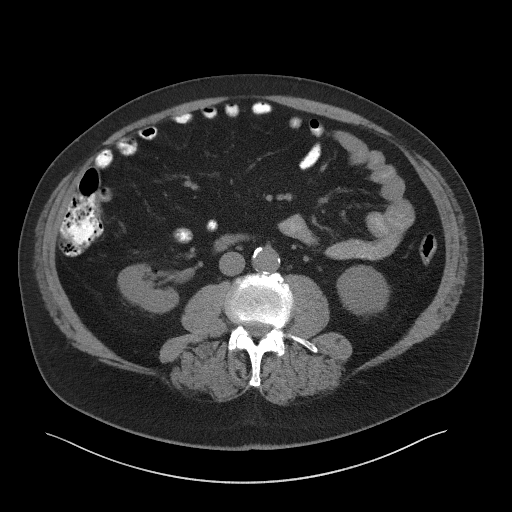
[im 62/111  lung]
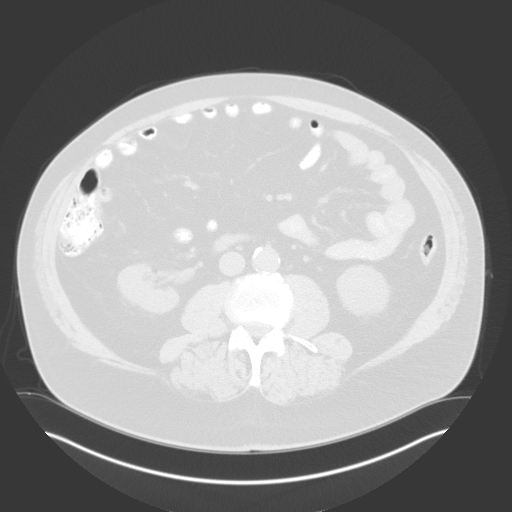
[im 74/111  soft-tissue]
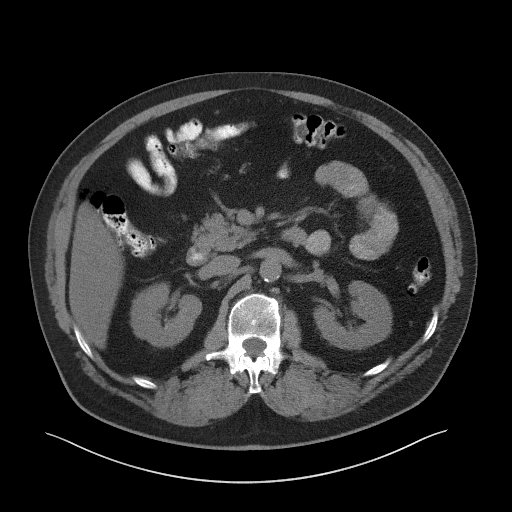
[im 74/111  lung]
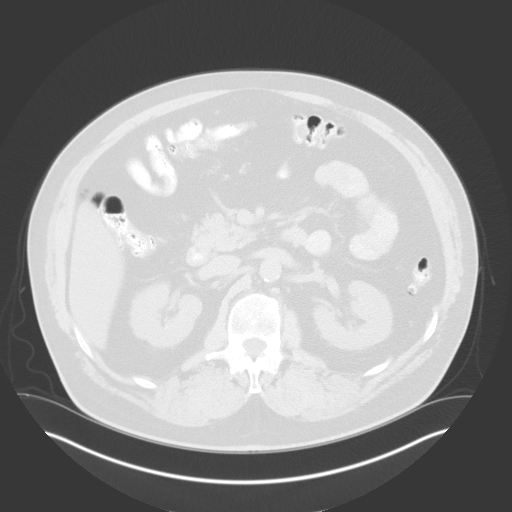
[im 86/111  soft-tissue]
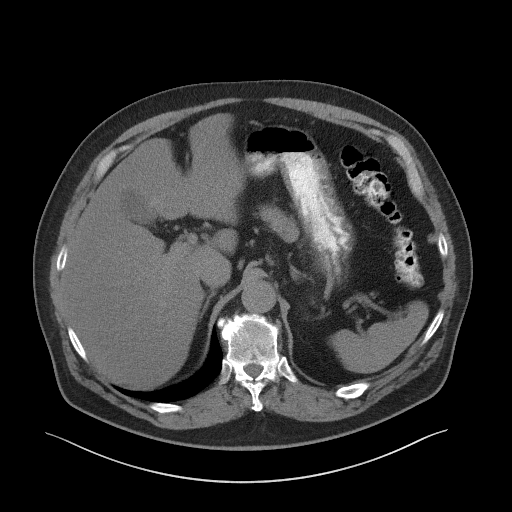
[im 86/111  lung]
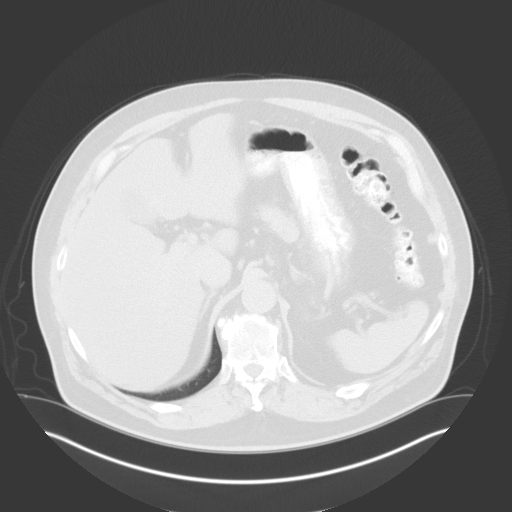
[im 98/111  soft-tissue]
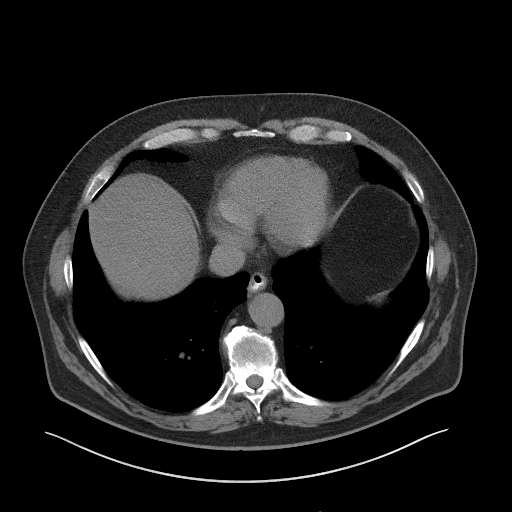
[im 98/111  lung]
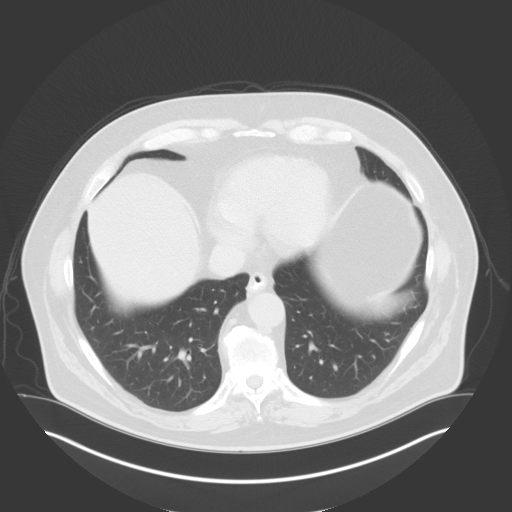

[Series 4: axial st · axial · 0.94mm/px · z∈[-1009,-764]mm · 5 of 111 slices shown (2 of 2)]
[im 13/111  soft-tissue]
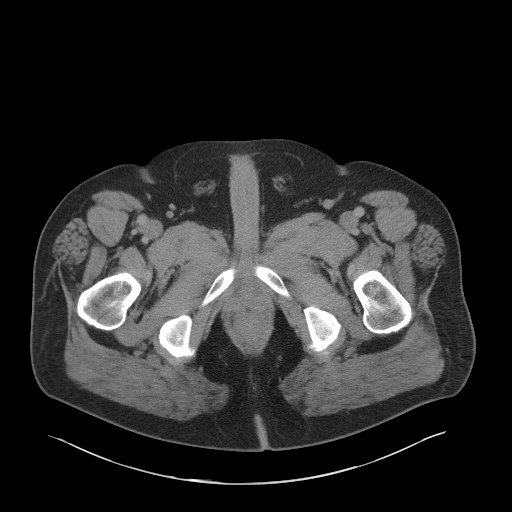
[im 25/111  soft-tissue]
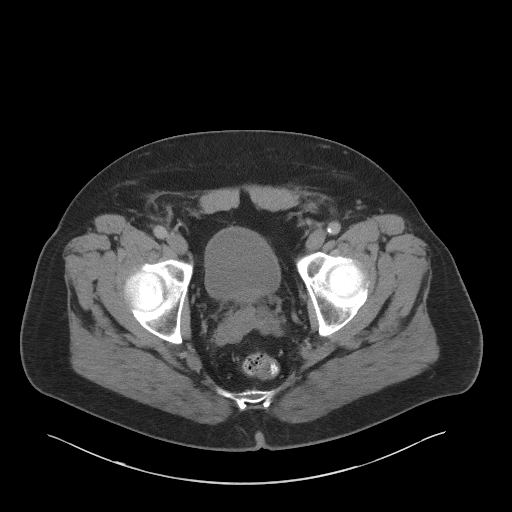
[im 37/111  soft-tissue]
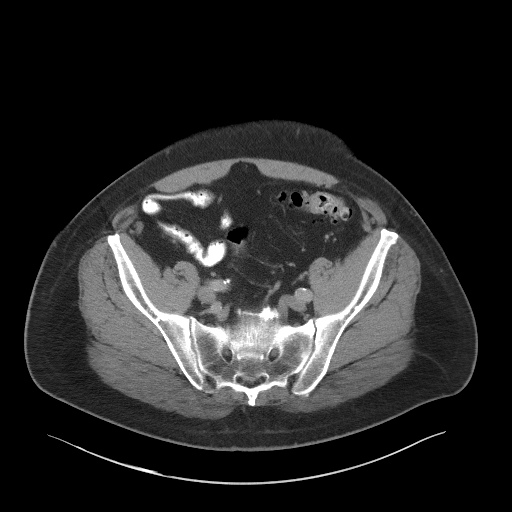
[im 49/111  soft-tissue]
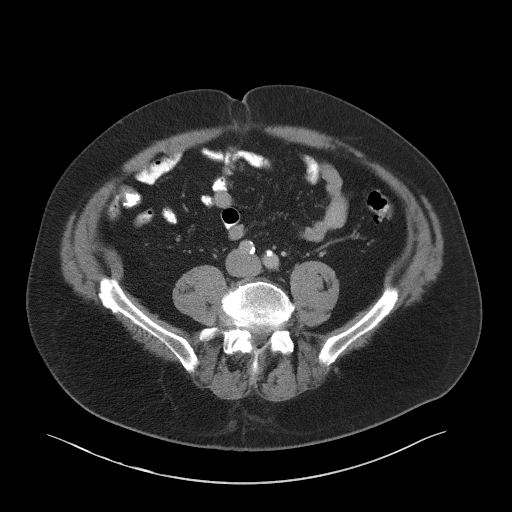
[im 62/111  soft-tissue]
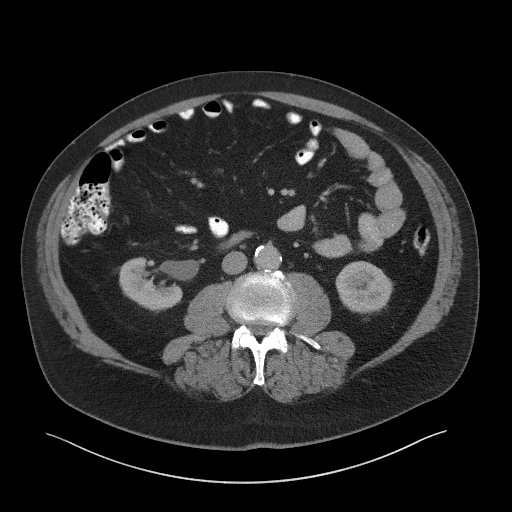

[13 of 32 positions shown; findings below may reference images not displayed]

FINDINGS: Lower Chest: No acute findings.

Hepatobiliary: No masses identified. Mild to moderate diffuse
hepatic steatosis. Tiny calcified gallstones noted, without evidence
of cholecystitis or biliary ductal dilatation.

Pancreas:  No mass or inflammatory changes.

Spleen: Within normal limits in size and appearance.

Adrenals/Urinary Tract: No adrenal masses identified. No evidence of
urolithiasis or hydronephrosis. No complex cystic or solid renal
masses are identified. Unremarkable unopacified urinary bladder.

Stomach/Bowel: No evidence of obstruction, inflammatory process or
abnormal fluid collections. Normal appendix visualized. Mild
diverticulosis of distal descending and proximal sigmoid colon,
without evidence of diverticulitis.

Vascular/Lymphatic: No pathologically enlarged lymph nodes. No
abdominal aortic aneurysm. Aortic atherosclerosis.

Reproductive: Normal size prostate gland. Asymmetric enlargement of
right seminal vesicle, suspicious for seminal vesicle involvement by
prostate carcinoma.

Other:  None.

Musculoskeletal:  No suspicious bone lesions identified.
IMPRESSION: Asymmetric enlargement of right seminal vesicle, suspicious for
involvement by prostate carcinoma. Consider prostate MRI without and
with contrast for further evaluation if clinically warranted.

No other signs of metastatic disease within the pelvis or abdomen.

Mild to moderate hepatic steatosis.

Cholelithiasis.  No radiographic evidence of cholecystitis.

Colonic diverticulosis. No radiographic evidence of diverticulitis.

Aortic atherosclerosis.

## 2020-02-15 ENCOUNTER — Other Ambulatory Visit: Payer: Self-pay | Admitting: Orthopedic Surgery

## 2020-02-15 DIAGNOSIS — G8929 Other chronic pain: Secondary | ICD-10-CM

## 2020-02-15 DIAGNOSIS — M25511 Pain in right shoulder: Secondary | ICD-10-CM

## 2020-03-07 ENCOUNTER — Other Ambulatory Visit: Payer: Self-pay

## 2020-03-07 ENCOUNTER — Ambulatory Visit
Admission: RE | Admit: 2020-03-07 | Discharge: 2020-03-07 | Disposition: A | Discharge status: Home / Self Care | Payer: Managed Care, Other (non HMO) | Source: Ambulatory Visit | Attending: Orthopedic Surgery | Admitting: Orthopedic Surgery

## 2020-03-07 DIAGNOSIS — G8929 Other chronic pain: Secondary | ICD-10-CM

## 2020-03-07 DIAGNOSIS — M25511 Pain in right shoulder: Secondary | ICD-10-CM

## 2021-08-08 ENCOUNTER — Telehealth: Payer: BLUE CROSS/BLUE SHIELD

## 2021-08-08 NOTE — Telephone Encounter (Signed)
Spoke with patient he wishes to remain with Dr.Byrnette for his Colonoscopy-contact information provided.

## 2021-12-27 ENCOUNTER — Encounter: Payer: Self-pay | Admitting: Family Medicine

## 2021-12-27 ENCOUNTER — Inpatient Hospital Stay
Admission: EM | Admit: 2021-12-27 | Discharge: 2021-12-30 | DRG: 309 | Disposition: A | Discharge status: Home / Self Care | Payer: No Typology Code available for payment source | Attending: Internal Medicine | Admitting: Internal Medicine

## 2021-12-27 DIAGNOSIS — N400 Enlarged prostate without lower urinary tract symptoms: Secondary | ICD-10-CM | POA: Diagnosis present

## 2021-12-27 DIAGNOSIS — I4892 Unspecified atrial flutter: Principal | ICD-10-CM | POA: Diagnosis present

## 2021-12-27 DIAGNOSIS — E785 Hyperlipidemia, unspecified: Secondary | ICD-10-CM | POA: Diagnosis not present

## 2021-12-27 DIAGNOSIS — I248 Other forms of acute ischemic heart disease: Secondary | ICD-10-CM | POA: Diagnosis present

## 2021-12-27 DIAGNOSIS — C61 Malignant neoplasm of prostate: Secondary | ICD-10-CM | POA: Diagnosis not present

## 2021-12-27 DIAGNOSIS — I081 Rheumatic disorders of both mitral and tricuspid valves: Secondary | ICD-10-CM | POA: Diagnosis present

## 2021-12-27 DIAGNOSIS — I2510 Atherosclerotic heart disease of native coronary artery without angina pectoris: Secondary | ICD-10-CM | POA: Diagnosis present

## 2021-12-27 DIAGNOSIS — Z79899 Other long term (current) drug therapy: Secondary | ICD-10-CM

## 2021-12-27 DIAGNOSIS — I4891 Unspecified atrial fibrillation: Secondary | ICD-10-CM | POA: Diagnosis present

## 2021-12-27 DIAGNOSIS — Z8546 Personal history of malignant neoplasm of prostate: Secondary | ICD-10-CM

## 2021-12-27 DIAGNOSIS — E669 Obesity, unspecified: Secondary | ICD-10-CM | POA: Diagnosis present

## 2021-12-27 DIAGNOSIS — F1090 Alcohol use, unspecified, uncomplicated: Secondary | ICD-10-CM | POA: Diagnosis present

## 2021-12-27 DIAGNOSIS — I429 Cardiomyopathy, unspecified: Secondary | ICD-10-CM | POA: Diagnosis present

## 2021-12-27 DIAGNOSIS — Z87891 Personal history of nicotine dependence: Secondary | ICD-10-CM

## 2021-12-27 DIAGNOSIS — G4733 Obstructive sleep apnea (adult) (pediatric): Secondary | ICD-10-CM | POA: Diagnosis present

## 2021-12-27 DIAGNOSIS — I440 Atrioventricular block, first degree: Secondary | ICD-10-CM | POA: Diagnosis present

## 2021-12-27 DIAGNOSIS — Z885 Allergy status to narcotic agent status: Secondary | ICD-10-CM

## 2021-12-27 DIAGNOSIS — Z20822 Contact with and (suspected) exposure to covid-19: Secondary | ICD-10-CM | POA: Diagnosis present

## 2021-12-27 DIAGNOSIS — Z7982 Long term (current) use of aspirin: Secondary | ICD-10-CM

## 2021-12-27 DIAGNOSIS — R778 Other specified abnormalities of plasma proteins: Secondary | ICD-10-CM | POA: Diagnosis not present

## 2021-12-27 DIAGNOSIS — Z791 Long term (current) use of non-steroidal anti-inflammatories (NSAID): Secondary | ICD-10-CM

## 2021-12-27 DIAGNOSIS — I471 Supraventricular tachycardia: Secondary | ICD-10-CM | POA: Diagnosis present

## 2021-12-27 DIAGNOSIS — I493 Ventricular premature depolarization: Secondary | ICD-10-CM | POA: Diagnosis present

## 2021-12-27 DIAGNOSIS — Z833 Family history of diabetes mellitus: Secondary | ICD-10-CM

## 2021-12-27 DIAGNOSIS — Z923 Personal history of irradiation: Secondary | ICD-10-CM

## 2021-12-27 DIAGNOSIS — Z789 Other specified health status: Secondary | ICD-10-CM | POA: Diagnosis present

## 2021-12-27 DIAGNOSIS — Z6839 Body mass index (BMI) 39.0-39.9, adult: Secondary | ICD-10-CM

## 2021-12-27 HISTORY — DX: Hyperlipidemia, unspecified: E78.5

## 2021-12-27 HISTORY — DX: Unspecified atrial flutter: I48.92

## 2021-12-27 HISTORY — DX: Atherosclerotic heart disease of native coronary artery without angina pectoris: I25.10

## 2021-12-27 LAB — CBC WITH DIFFERENTIAL/PLATELET
Abs Immature Granulocytes: 0.03 10*3/uL (ref 0.00–0.07)
Basophils Absolute: 0 10*3/uL (ref 0.0–0.1)
Basophils Relative: 1 %
Eosinophils Absolute: 0.3 10*3/uL (ref 0.0–0.5)
Eosinophils Relative: 4 %
HCT: 45.7 % (ref 39.0–52.0)
Hemoglobin: 15.1 g/dL (ref 13.0–17.0)
Immature Granulocytes: 0 %
Lymphocytes Relative: 24 %
Lymphs Abs: 1.9 10*3/uL (ref 0.7–4.0)
MCH: 29.7 pg (ref 26.0–34.0)
MCHC: 33 g/dL (ref 30.0–36.0)
MCV: 90 fL (ref 80.0–100.0)
Monocytes Absolute: 0.6 10*3/uL (ref 0.1–1.0)
Monocytes Relative: 7 %
Neutro Abs: 5.1 10*3/uL (ref 1.7–7.7)
Neutrophils Relative %: 64 %
Platelets: 179 10*3/uL (ref 150–400)
RBC: 5.08 MIL/uL (ref 4.22–5.81)
RDW: 14.2 % (ref 11.5–15.5)
WBC: 7.9 10*3/uL (ref 4.0–10.5)
nRBC: 0 % (ref 0.0–0.2)

## 2021-12-27 LAB — MAGNESIUM: Magnesium: 1.8 mg/dL (ref 1.7–2.4)

## 2021-12-27 LAB — COMPREHENSIVE METABOLIC PANEL
ALT: 80 U/L — ABNORMAL HIGH (ref 0–44)
AST: 47 U/L — ABNORMAL HIGH (ref 15–41)
Albumin: 3.9 g/dL (ref 3.5–5.0)
Alkaline Phosphatase: 54 U/L (ref 38–126)
Anion gap: 11 (ref 5–15)
BUN: 19 mg/dL (ref 8–23)
CO2: 28 mmol/L (ref 22–32)
Calcium: 9.2 mg/dL (ref 8.9–10.3)
Chloride: 103 mmol/L (ref 98–111)
Creatinine, Ser: 0.77 mg/dL (ref 0.61–1.24)
GFR, Estimated: >60 mL/min (ref >60)
Glucose, Bld: 146 mg/dL — ABNORMAL HIGH (ref 70–99)
Potassium: 3.7 mmol/L (ref 3.5–5.1)
Sodium: 142 mmol/L (ref 135–145)
Total Bilirubin: 0.5 mg/dL (ref 0.3–1.2)
Total Protein: 7.4 g/dL (ref 6.5–8.1)

## 2021-12-27 LAB — PROTIME-INR
INR: 0.9 (ref 0.8–1.2)
Prothrombin Time: 12.6 seconds (ref 11.4–15.2)

## 2021-12-27 LAB — TROPONIN I (HIGH SENSITIVITY)
Troponin I (High Sensitivity): 24 ng/L — ABNORMAL HIGH (ref <18)
Troponin I (High Sensitivity): 115 ng/L (ref <18)
Troponin I (High Sensitivity): 103 ng/L (ref <18)
Troponin I (High Sensitivity): 74 ng/L — ABNORMAL HIGH (ref <18)

## 2021-12-27 LAB — RESP PANEL BY RT-PCR (FLU A&B, COVID) ARPGX2
Influenza A by PCR: NEGATIVE
Influenza B by PCR: NEGATIVE
SARS Coronavirus 2 by RT PCR: NEGATIVE

## 2021-12-27 LAB — HEPARIN LEVEL (UNFRACTIONATED)
Heparin Unfractionated: 0.17 IU/mL — ABNORMAL LOW (ref 0.30–0.70)
Heparin Unfractionated: 0.38 IU/mL (ref 0.30–0.70)

## 2021-12-27 LAB — LIPASE, BLOOD: Lipase: 43 U/L (ref 11–51)

## 2021-12-27 LAB — HEMOGLOBIN A1C
Hgb A1c MFr Bld: 5.8 % — ABNORMAL HIGH (ref 4.8–5.6)
Mean Plasma Glucose: 119.76 mg/dL

## 2021-12-27 LAB — APTT: aPTT: 30 seconds (ref 24–36)

## 2021-12-27 MED ORDER — ASPIRIN 81 MG PO CHEW: 324.0000 mg | CHEWABLE_TABLET | Freq: Once | ORAL | Status: DC

## 2021-12-27 MED ORDER — ONDANSETRON HCL 4 MG/2ML IJ SOLN: 4.0000 mg | Freq: Three times a day (TID) | INTRAMUSCULAR | Status: DC | PRN

## 2021-12-27 MED ORDER — POTASSIUM CHLORIDE CRYS ER 20 MEQ PO TBCR
40.0000 meq | EXTENDED_RELEASE_TABLET | Freq: Once | ORAL | Status: AC
  Administered 2021-12-27: 40 meq via ORAL
  Filled 2021-12-27: qty 2

## 2021-12-27 MED ORDER — HEPARIN BOLUS VIA INFUSION
6000.0000 [IU] | Freq: Once | INTRAVENOUS | Status: AC
  Administered 2021-12-27: 6000 [IU] via INTRAVENOUS
  Filled 2021-12-27: qty 6000

## 2021-12-27 MED ORDER — LORAZEPAM 2 MG/ML IJ SOLN: 0.0000 mg | Freq: Two times a day (BID) | INTRAMUSCULAR | Status: DC

## 2021-12-27 MED ORDER — THIAMINE HCL 100 MG PO TABS
100.0000 mg | ORAL_TABLET | Freq: Every day | ORAL | Status: DC
  Administered 2021-12-27 – 2021-12-30 (×4): 100 mg via ORAL
  Filled 2021-12-27 (×4): qty 1

## 2021-12-27 MED ORDER — TAMSULOSIN HCL 0.4 MG PO CAPS
0.4000 mg | ORAL_CAPSULE | Freq: Every day | ORAL | Status: DC
  Administered 2021-12-27 – 2021-12-30 (×4): 0.4 mg via ORAL
  Filled 2021-12-27 (×4): qty 1

## 2021-12-27 MED ORDER — ADULT MULTIVITAMIN W/MINERALS CH
1.0000 | ORAL_TABLET | Freq: Every day | ORAL | Status: DC
  Administered 2021-12-27 – 2021-12-30 (×4): 1 via ORAL
  Filled 2021-12-27 (×4): qty 1

## 2021-12-27 MED ORDER — FLECAINIDE ACETATE 50 MG PO TABS
75.0000 mg | ORAL_TABLET | Freq: Two times a day (BID) | ORAL | Status: DC
  Administered 2021-12-27 – 2021-12-30 (×6): 75 mg via ORAL
  Filled 2021-12-27 (×8): qty 2

## 2021-12-27 MED ORDER — LORAZEPAM 2 MG/ML IJ SOLN: 0.0000 mg | Freq: Four times a day (QID) | INTRAMUSCULAR | Status: AC

## 2021-12-27 MED ORDER — METOPROLOL TARTRATE 25 MG PO TABS
12.5000 mg | ORAL_TABLET | Freq: Two times a day (BID) | ORAL | Status: DC
  Administered 2021-12-27 – 2021-12-29 (×5): 12.5 mg via ORAL
  Filled 2021-12-27 (×5): qty 1

## 2021-12-27 MED ORDER — DILTIAZEM HCL-DEXTROSE 125-5 MG/125ML-% IV SOLN (PREMIX)
5.0000 mg/h | INTRAVENOUS | Status: DC
  Administered 2021-12-27: 5 mg/h via INTRAVENOUS
  Administered 2021-12-28 – 2021-12-30 (×5): 10 mg/h via INTRAVENOUS
  Filled 2021-12-27 (×6): qty 125

## 2021-12-27 MED ORDER — FLECAINIDE ACETATE 50 MG PO TABS
50.0000 mg | ORAL_TABLET | Freq: Two times a day (BID) | ORAL | Status: DC
  Administered 2021-12-27: 50 mg via ORAL
  Filled 2021-12-27 (×2): qty 1

## 2021-12-27 MED ORDER — LORAZEPAM 2 MG/ML IJ SOLN: 1.0000 mg | INTRAMUSCULAR | Status: DC | PRN

## 2021-12-27 MED ORDER — METOPROLOL SUCCINATE ER 50 MG PO TB24: 25.0000 mg | ORAL_TABLET | Freq: Every day | ORAL | Status: DC

## 2021-12-27 MED ORDER — METOPROLOL TARTRATE 5 MG/5ML IV SOLN
5.0000 mg | Freq: Once | INTRAVENOUS | Status: AC
  Administered 2021-12-27: 5 mg via INTRAVENOUS
  Filled 2021-12-27: qty 5

## 2021-12-27 MED ORDER — THIAMINE HCL 100 MG/ML IJ SOLN: 100.0000 mg | Freq: Every day | INTRAMUSCULAR | Status: DC

## 2021-12-27 MED ORDER — METOPROLOL TARTRATE 5 MG/5ML IV SOLN
2.5000 mg | INTRAVENOUS | Status: DC | PRN
  Administered 2021-12-27: 2.5 mg via INTRAVENOUS
  Filled 2021-12-27 (×2): qty 5

## 2021-12-27 MED ORDER — LACTATED RINGERS IV BOLUS
1000.0000 mL | Freq: Once | INTRAVENOUS | Status: AC
Start: 2021-12-27 — End: 2021-12-27
  Administered 2021-12-27: 1000 mL via INTRAVENOUS

## 2021-12-27 MED ORDER — ACETAMINOPHEN 325 MG PO TABS: 650.0000 mg | ORAL_TABLET | Freq: Four times a day (QID) | ORAL | Status: DC | PRN

## 2021-12-27 MED ORDER — FOLIC ACID 1 MG PO TABS
1.0000 mg | ORAL_TABLET | Freq: Every day | ORAL | Status: DC
  Administered 2021-12-27 – 2021-12-30 (×4): 1 mg via ORAL
  Filled 2021-12-27 (×4): qty 1

## 2021-12-27 MED ORDER — HEPARIN BOLUS VIA INFUSION
3100.0000 [IU] | Freq: Once | INTRAVENOUS | Status: AC
Start: 2021-12-27 — End: 2021-12-27
  Administered 2021-12-27: 3100 [IU] via INTRAVENOUS
  Filled 2021-12-27: qty 3100

## 2021-12-27 MED ORDER — ASPIRIN EC 325 MG PO TBEC: 325.0000 mg | DELAYED_RELEASE_TABLET | Freq: Every day | ORAL | Status: DC

## 2021-12-27 MED ORDER — LORAZEPAM 1 MG PO TABS: 1.0000 mg | ORAL_TABLET | ORAL | Status: DC | PRN

## 2021-12-27 MED ORDER — HEPARIN (PORCINE) 25000 UT/250ML-% IV SOLN
2100.0000 [IU]/h | INTRAVENOUS | Status: DC
  Administered 2021-12-27: 1500 [IU]/h via INTRAVENOUS
  Administered 2021-12-27: 1800 [IU]/h via INTRAVENOUS
  Administered 2021-12-28: 1900 [IU]/h via INTRAVENOUS
  Administered 2021-12-28 – 2021-12-30 (×4): 2100 [IU]/h via INTRAVENOUS
  Filled 2021-12-27 (×7): qty 250

## 2021-12-27 MED ORDER — MAGNESIUM SULFATE IN D5W 1-5 GM/100ML-% IV SOLN
1.0000 g | Freq: Once | INTRAVENOUS | Status: AC
  Administered 2021-12-27: 1 g via INTRAVENOUS
  Filled 2021-12-27: qty 100

## 2021-12-27 MED ORDER — ASPIRIN EC 81 MG PO TBEC
81.0000 mg | DELAYED_RELEASE_TABLET | Freq: Every day | ORAL | Status: DC
  Administered 2021-12-28 – 2021-12-30 (×3): 81 mg via ORAL
  Filled 2021-12-27 (×3): qty 1

## 2021-12-27 MED ORDER — METOPROLOL TARTRATE 25 MG PO TABS
25.0000 mg | ORAL_TABLET | ORAL | Status: AC
  Administered 2021-12-27: 25 mg via ORAL
  Filled 2021-12-27: qty 1

## 2021-12-27 NOTE — Consult Note (Signed)
Cardiology Consultation:   Patient ID: Stephen Sherman MRN: [id number]; DOB: 22-May-1956  Admit date: 12/27/2021 Date of Consult: 12/27/2021  PCP:  [name], MD   [facility] Providers Cardiologist:  Nelva Bush, MD     New-requests Dr. Saunders Revel  Patient Profile:   Stephen Sherman is a 66 y.o. male with a hx of non-obstructive CAD, hyperlipidemia, OSA, paroxysmal atrial flutter, prostate cancer  who is being seen 12/27/2021 for the evaluation of atrial flutter at the request of Dr. Blaine Hamper.  History of Present Illness:   Stephen Sherman presented to the hospital with new onset of palpitations that occurred last night.  It awakened him from sleep.  It was associated with chest burning as well as lightheadedness.  His heart rate was reportedly 245 on his watch monitor in 220 but per EMS.  He was given a dose of IV metoprolol with improvement in his heart rate to the 120s.  EKG showed atrial flutter.  He reported compliance with his home flecainide and metoprolol.  He noted that he drinks 2-3 beers each day, unchanged from his baseline.  High-sensitivity troponin was elevated from 21-->115-->103-->74.  COVID-19 was negative.  He was given a full-strength aspirin and his home flecainide was continued.  He was started on IV heparin.  His cardiologist is Dr. Naida Sleight at [facility].  He was last seen 03/2020.  He previously had a successful TEE/DCCV on 11/2019.  Echo revealed LVEF >55%.  Moderate LA enlargement.  He had recurrent episode later that month.  He was given flecainide to take as a pill in the pocket approach.  When he took the flecainide it slowed his heart rate but he did not convert into sinus rhythm.  He subsequently spontaneously converted.  He denies any recurrent atrial fibrillation/flutter since that time.   He was started on rosuvastatin for coronary calcium noted on chest CT.  He was not continued on anticoagulation given that his CHADS2 Vascor was 1.  They encouraged him to abstain from alcohol and  lose weight.  He was also referred for sleep study but it was never performed.  He notes that he snores, has apnea and daytime somnolence.  He thinks that he has OSA.  He has a pot of 1/2 decaf, 1/2 regular coffee daily.  He has at lease 1 beer daily, sometimes 4 socially.  His brother and sister both have atrial fibrillation.   He hasn't missed any doses of metoprolol or flecainide.   Stephen Sherman has a history of prostate cancer and underwent radiation therapy.  PSA has been well controlled.   Past Medical History:  Diagnosis Date   Atrial flutter ([place])    CAD (coronary artery disease)    Cancer ([place])    Prostate   HLD (hyperlipidemia)     Past Surgical History:  Procedure Laterality Date   COLONOSCOPY  2012   GUM SURGERY  2009,2012   HERNIA REPAIR Bilateral 2012   INGUNIAL   ORIF ANKLE FRACTURE Left 08/18/2017   Procedure: OPEN REDUCTION INTERNAL FIXATION (ORIF) ANKLE FRACTURE;  Surgeon: Earnestine Leys, MD;  Location: [facility] ORS;  Service: Orthopedics;  Laterality: Left;     Home Medications:  Prior to Admission medications   Medication Sig Start Date End Date Taking? Authorizing Provider  aspirin EC 325 MG tablet Take 1 tablet (325 mg total) by mouth 2 (two) times daily. 08/19/17  Yes Earnestine Leys, MD  flecainide (TAMBOCOR) 100 MG tablet Take 50 mg by mouth 2 (two) times daily.  Yes [provider]  ibuprofen (ADVIL,MOTRIN) 200 MG tablet Take 200 mg by mouth every 6 (six) hours as needed.   Yes [provider]  metoprolol succinate (TOPROL-XL) 25 MG 24 hr tablet Take 1 tablet by mouth daily. 03/19/20  Yes [provider]  rosuvastatin (CRESTOR) 5 MG tablet Take 1 tablet by mouth daily. 03/06/20  Yes [provider]  tamsulosin (FLOMAX) 0.4 MG CAPS capsule Take 0.4 mg by mouth daily.   Yes [provider]  vitamin E 400 UNIT capsule Take 400 Units by mouth daily.   Yes [provider]  bicalutamide (CASODEX) 50 MG tablet Take 50 mg  by mouth daily. Patient not taking: Reported on 12/27/2021 08/10/17   [provider]  gabapentin (NEURONTIN) 400 MG capsule Take 1 capsule (400 mg total) by mouth 2 (two) times daily. Patient not taking: Reported on 12/27/2021 08/19/17   Earnestine Leys, MD  HYDROcodone-acetaminophen [facility]) 7.5-325 MG tablet Take 1 tablet by mouth every 6 (six) hours as needed for moderate pain. Patient not taking: Reported on 12/27/2021 08/19/17   Earnestine Leys, MD  leuprolide (LUPRON) 22.5 MG injection Inject 22.5 mg into the muscle every 3 (three) months. Patient not taking: Reported on 12/27/2021    [provider]  levofloxacin (LEVAQUIN) 500 MG tablet Take 500 mg by mouth daily. Patient not taking: Reported on 12/27/2021 08/09/17   [provider]  meloxicam (MOBIC) 15 MG tablet Take 1 tablet (15 mg total) by mouth daily. Patient not taking: Reported on 12/27/2021 08/19/17   Earnestine Leys, MD  vitamin C (ASCORBIC ACID) 500 MG tablet Take 500 mg by mouth daily. Patient not taking: Reported on 12/27/2021    [provider]    Inpatient Medications: Scheduled Meds:  aspirin  324 mg Oral Once   [START ON 12/28/2021] aspirin EC  325 mg Oral Daily   flecainide  50 mg Oral A25K   folic acid  1 mg Oral Daily   LORazepam  0-4 mg Intravenous Q6H   Followed by   Derrill Memo ON 12/29/2021] LORazepam  0-4 mg Intravenous Q12H   [START ON 12/28/2021] metoprolol succinate  25 mg Oral Daily   multivitamin with minerals  1 tablet Oral Daily   tamsulosin  0.4 mg Oral Daily   thiamine  100 mg Oral Daily   Or   thiamine  100 mg Intravenous Daily   Continuous Infusions:  diltiazem (CARDIZEM) infusion     heparin 1,500 Units/hr (12/27/21 0641)   PRN Meds: acetaminophen, LORazepam **OR** LORazepam, metoprolol tartrate, ondansetron (ZOFRAN) IV  Allergies:    Allergies  Allergen Reactions   Morphine Nausea Only    Social History:   Social History   Socioeconomic History   Marital  status: Single    Spouse name: Not on file   Number of children: Not on file   Years of education: Not on file   Highest education level: Not on file  Occupational History   Not on file  Tobacco Use   Smoking status: Former    Packs/day: 1.00    Years: 28.00    Pack years: 28.00    Types: Cigarettes    Quit date: 12/17/2003    Years since quitting: 18.0   Smokeless tobacco: Never  Vaping Use   Vaping Use: Never used  Substance and Sexual Activity   Alcohol use: Yes    Comment: 2+ beers a day.   Drug use: No   Sexual activity: Not on file  Other Topics Concern   Not on file  Social History Narrative   Not on file   Social Determinants of Health   Financial Resource Strain: Not on file  Food Insecurity: Not on file  Transportation Needs: Not on file  Physical Activity: Not on file  Stress: Not on file  Social Connections: Not on file  Intimate Partner Violence: Not on file    Family History:    Family History  Problem Relation Age of Onset   Diabetes Mother      ROS:  Please see the history of present illness.  All other ROS reviewed and negative.     Physical Exam/Data:   Vitals:   12/27/21 0700 12/27/21 0735 12/27/21 0830 12/27/21 1030  BP: 111/89  109/68 (!) 109/93  Pulse: (!) 109  (!) 110 (!) 125  Resp: 20  16 14   Temp:  98.1 F (36.7 C)    TempSrc:  Oral    SpO2: 95%  95% 97%  Weight:      Height:       No intake or output data in the 24 hours ending 12/27/21 1403 Last 3 Weights 12/27/2021 01/24/2018 08/18/2017  Weight (lbs) 283 lb 4.7 oz 276 lb 276 lb  Weight (kg) 128.5 kg 125.193 kg 125.193 kg   VS:  BP (!) 109/93    Pulse (!) 125    Temp 98.1 F (36.7 C) (Oral)    Resp 14    Ht 5\' 11"  (1.803 m)    Wt 128.5 kg    SpO2 97%    BMI 39.51 kg/m  , BMI Body mass index is 39.51 kg/m. GENERAL:  Well appearing HEENT: Pupils equal round and reactive, fundi not visualized, oral mucosa unremarkable NECK:  No jugular venous distention, waveform within  normal limits, carotid upstroke brisk and symmetric, no bruits, no thyromegaly LUNGS:  Clear to auscultation bilaterally HEART:  Tachycardic.  Irregularly irregular.   PMI not displaced or sustained,S1 and S2 within normal limits, no S3, no S4, no clicks, no rubs, no murmurs ABD:  Flat, positive bowel sounds normal in frequency in pitch, no bruits, no rebound, no guarding, no midline pulsatile mass, no hepatomegaly, no splenomegaly EXT:  2 plus pulses throughout, no edema, no cyanosis no clubbing SKIN:  No rashes no nodules NEURO:  Cranial nerves II through XII grossly intact, motor grossly intact throughout PSYCH:  Cognitively intact, oriented to person place and time   EKG:  The EKG was personally reviewed and demonstrates:  atrial flutter.  2:1 AV block.  Rate 116 bpm.  Telemetry:  Telemetry was personally reviewed and demonstrates:  atrial flutter.  120 bpm.  PVCs  Relevant CV Studies: Echo pending.  Laboratory Data:  High Sensitivity Troponin:   Recent Labs  Lab 12/27/21 0243 12/27/21 0507 12/27/21 0740 12/27/21 0930  TROPONINIHS 24* 115* 103* 74*     Chemistry Recent Labs  Lab 12/27/21 0243  NA 142  K 3.7  CL 103  CO2 28  GLUCOSE 146*  BUN 19  CREATININE 0.77  CALCIUM 9.2  MG 1.8  GFRNONAA >60  ANIONGAP 11    Recent Labs  Lab 12/27/21 0243  PROT 7.4  ALBUMIN 3.9  AST 47*  ALT 80*  ALKPHOS 54  BILITOT 0.5   Lipids No results for input(s): CHOL, TRIG, HDL, LABVLDL, LDLCALC, CHOLHDL in the last 168 hours.  Hematology Recent Labs  Lab 12/27/21 0243  WBC 7.9  RBC 5.08  HGB 15.1  HCT 45.7  MCV 90.0  MCH 29.7  MCHC 33.0  RDW 14.2  PLT 179   Thyroid No results for input(s): TSH, FREET4 in the last 168 hours.  BNPNo results for input(s): BNP, PROBNP in the last 168 hours.  DDimer No results for input(s): DDIMER in the last 168 hours.   Radiology/Studies:  No results found.   Assessment and Plan:   # Atrial flutter:  # Likely OSA:  He  remains in atrial flutter.  Prior TEE/DCCV 11/2019.  He has done well on flecainide in the interim.  Rates are poorly controlled.  We will start a diltiazem drip.  Continue heparin for now.  Echo pending.  If LVEF is wnl, will stop heparin and switch to oral anticoagulation.  This patients CHA2DS2-VASc Score and unadjusted Ischemic Stroke Rate (% per year) is equal to 0.6 % stroke rate/year from a score of 1  Above score calculated as 1 point each if present [CHF, HTN, DM, Vascular=MI/PAD/Aortic Plaque, Age if 65-74, or Male] Above score calculated as 2 points each if present [Age > 75, or Stroke/TIA/TE]  He will be 66 this year which will give him a score of 2.  A1c is pending.  He needs anticoagulation for at least 30 days.  If he remains in atrial fibrillaion, plan for TEE/DCCV Monday.  Increase flecainide to 75mg  bid.   Continue metoprolol.  We discussed limiting EtOH and caffeine.  He needs a sleep study.   # Elevated troponin:  # CAD:  # Hyperlipidemia:  He has no chest pain.  Likely demand ischemia.  Medical management unless he has recurrent chest discomfort after rate control.  LDL goal <70. Continue rosuvastatin.  Lipid panel pending.    Risk Assessment/Risk Scores:          CHA2DS2-VASc Score =     This indicates a  % annual risk of stroke. The patient's score is based upon:           For questions or updates, please contact [place] Please consult [email or web address] for contact info under    Signed, Skeet Latch, MD  12/27/2021 2:03 PM

## 2021-12-27 NOTE — Progress Notes (Signed)
ANTICOAGULATION CONSULT NOTE  Pharmacy Consult for heparin infusion Indication: A-Fib (A-flutter w/ RVR and elevated troponin)  Allergies  Allergen Reactions   Morphine Nausea Only    Patient Measurements: Height: 5\' 11"  (180.3 cm) Weight: 128.5 kg (283 lb 4.7 oz) IBW/kg (Calculated) : 75.3 Heparin Dosing Weight: 104.4 kg  Vital Signs: Temp: 98 F (36.7 C) (02/11 2022) Temp Source: Oral (02/11 2022) BP: 118/80 (02/11 2022) Pulse Rate: 125 (02/11 2022)  Labs: Recent Labs    12/27/21 0243 12/27/21 0507 12/27/21 0740 12/27/21 0930 12/27/21 1428 12/27/21 2025  HGB 15.1  --   --   --   --   --   HCT 45.7  --   --   --   --   --   PLT 179  --   --   --   --   --   APTT 30  --   --   --   --   --   LABPROT 12.6  --   --   --   --   --   INR 0.9  --   --   --   --   --   HEPARINUNFRC  --   --   --   --  0.17* 0.38  CREATININE 0.77  --   --   --   --   --   TROPONINIHS 24* 115* 103* 74*  --   --      Estimated Creatinine Clearance: 127.5 mL/min (by C-G formula based on SCr of 0.77 mg/dL).   Medical History: Past Medical History:  Diagnosis Date   Atrial flutter ([facility])    CAD (coronary artery disease)    Cancer ([facility])    Prostate   HLD (hyperlipidemia)     Assessment: Pt is 66 yo male with h/o A-fib presenting to ED c/o being woke up by sudden onset of chest discomfort, found with A-flutter and slightly elevated Troponin I.  Date Time HL Rate/comment 2/11 1428 0.17 1500 un/hr, subthera 2/11 2025 0.38 1800 un/hr, therapeutic  Goal of Therapy:  Heparin level 0.3-0.7 units/ml Monitor platelets by anticoagulation protocol: Yes   Plan:  Heparin level 0.38, therapeutic Continue heparin infusion at 1800 units/hr Check confirmatory HL in 6 hrs  CBC daily while on heparin  Darnelle Bos, PharmD Clinical Pharmacist 12/27/2021 9:39 PM

## 2021-12-27 NOTE — TOC Progression Note (Signed)
Transition of Care [facility]) - Progression Note    Patient Details  Name: Stephen Sherman MRN: [id number] Date of Birth: [birth year]  Transition of Care [facility]) CM/SW [place], [place] Phone Number: 12/27/2021, 3:31 PM  Clinical Narrative:     CSW spoke to patient and patient denied substance use concerns. CSW provided substance use and outpatient mental health resources and patient accepted. No further TOC needs.        Expected Discharge Plan and Services                                                 Social Determinants of Health (SDOH) Interventions    Readmission Risk Interventions No flowsheet data found.

## 2021-12-27 NOTE — ED Triage Notes (Signed)
Pt to ED via [facility] from home with c/o sudden onset chest discomfort that awoke him out of sleep, per EMS pt c/o "funny feeling in chest". Per EMS initial 12 lead with HR 222, 5mg  IV Metoprolol given en route, HR rate slowed to 113 ST. Per EMS no hx of SVT however patient does have a hx of A-fib, per EMS pt does take Metoprolol at home for A-fib. Pt arrives to ED A&O x4.    140/30

## 2021-12-27 NOTE — H&P (Signed)
History and Physical    Stephen Sherman JYN:[id number] DOB: [birth year] DOA: 12/27/2021  Referring MD/NP/PA:   PCP: [name], MD   Patient coming from:  The patient is coming from home.  At baseline, pt is independent for most of ADL.        Chief Complaint: palpitation  HPI: Stephen Sherman is a 66 y.o. male with medical history significant of A flutter, HLD, CAD, prostate cancer,  alcohol use, who presents with palpitation.   Patient states that his symptoms started in the middle night, described as palpitation, chest discomfort with burning sensation, lightheadedness.  Denies active chest pain, cough, shortness of breath. He checked his pulse oximeter which also has a heart rate monitor and his heart rate was listed as 245. Per EMS report, he had HR of 220 and the rhythm strip was reading as supraventricular tachycardia.  They called EDP for medical advice. Dr. Karma Greaser of ED recommended holding adenosine, but giving a dose of metoprolol 5 mg IV since the patient is on metoprolol daily. That dramatically improved the patient's heart rate and brought him down to about 120. Pt was given another dose of oral metoprolol 25 mg in ED. His HR is 100-120s in ED. EKG showed atrial flutter with RVR.  Patient states that he is taking flecainide and metoprolol at home.  Denies nausea, vomiting, diarrhea or abdominal pain.  No symptoms of UTI. Pt was given 324 mg of ASA in ED. Patient states that he drinks beer every day, 2-3 beers each day.    Data Reviewed and ED Course: pt was found to have WBC 7.9, troponin level 21 --> 115, INR 0.9, PTT 30, negative COVID PCR, lipase of 43, GFR > 60, blood pressure 119/81, RR 24, 19, oxygen saturation 96% on room air.  Patient is placed progressive bed for observation.  Dr. Oval Linsey of cardiology is consulted.  EKG: I have personally reviewed.  Atrial flutter, 2: 1 AV block, QTc 420, low voltage, Q waves in lead III   Review of Systems:   General: no fevers, chills,  no body weight gain, fatigue HEENT: no blurry vision, hearing changes or sore throat Respiratory: no dyspnea, coughing, wheezing CV: has chest discomfort, palpitations GI: no nausea, vomiting, abdominal pain, diarrhea, constipation GU: no dysuria, burning on urination, increased urinary frequency, hematuria  Ext: no leg edema Neuro: no unilateral weakness, numbness, or tingling, no vision change or hearing loss.  Has lightheadedness. Skin: no rash, no skin tear. MSK: No muscle spasm, no deformity, no limitation of range of movement in spin Heme: No easy bruising.  Travel history: No recent long distant travel.   Allergy:  Allergies  Allergen Reactions   Morphine Nausea Only    Past Medical History:  Diagnosis Date   Atrial flutter ([place])    CAD (coronary artery disease)    Cancer ([place])    Prostate   HLD (hyperlipidemia)     Past Surgical History:  Procedure Laterality Date   COLONOSCOPY  2012   GUM SURGERY  2009,2012   HERNIA REPAIR Bilateral 2012   INGUNIAL   ORIF ANKLE FRACTURE Left 08/18/2017   Procedure: OPEN REDUCTION INTERNAL FIXATION (ORIF) ANKLE FRACTURE;  Surgeon: Earnestine Leys, MD;  Location: [facility] ORS;  Service: Orthopedics;  Laterality: Left;    Social History:  reports that he quit smoking about 18 years ago. He has a 28.00 pack-year smoking history. He has never used smokeless tobacco. He reports current alcohol use. He reports that he does  not use drugs.  Family History:  Family History  Problem Relation Age of Onset   Diabetes Mother      Prior to Admission medications   Medication Sig Start Date End Date Taking? Authorizing Provider  aspirin EC 325 MG tablet Take 1 tablet (325 mg total) by mouth 2 (two) times daily. 08/19/17   Earnestine Leys, MD  bicalutamide (CASODEX) 50 MG tablet Take 50 mg by mouth daily. 08/10/17   [provider]  gabapentin (NEURONTIN) 400 MG capsule Take 1 capsule (400 mg total) by mouth 2 (two) times daily. 08/19/17    Earnestine Leys, MD  HYDROcodone-acetaminophen (NORCO) 7.5-325 MG tablet Take 1 tablet by mouth every 6 (six) hours as needed for moderate pain. 08/19/17   Earnestine Leys, MD  ibuprofen (ADVIL,MOTRIN) 200 MG tablet Take 200 mg by mouth every 6 (six) hours as needed.    [provider]  leuprolide (LUPRON) 22.5 MG injection Inject 22.5 mg into the muscle every 3 (three) months.    [provider]  levofloxacin (LEVAQUIN) 500 MG tablet Take 500 mg by mouth daily. 08/09/17   [provider]  meloxicam (MOBIC) 15 MG tablet Take 1 tablet (15 mg total) by mouth daily. 08/19/17   Earnestine Leys, MD  tamsulosin (FLOMAX) 0.4 MG CAPS capsule Take 0.4 mg by mouth daily.    [provider]  vitamin C (ASCORBIC ACID) 500 MG tablet Take 500 mg by mouth daily.    [provider]  vitamin E 400 UNIT capsule Take 400 Units by mouth daily.    [provider]    Physical Exam: Vitals:   12/27/21 0605 12/27/21 0700 12/27/21 0735 12/27/21 0830  BP:  111/89  109/68  Pulse:  (!) 109  (!) 110  Resp:  20  16  Temp:   98.1 F (36.7 C)   TempSrc:   Oral   SpO2:  95%  95%  Weight: 128.5 kg     Height: 5\' 11"  (1.803 m)      General: Not in acute distress HEENT:       Eyes: PERRL, EOMI, no scleral icterus.       ENT: No discharge from the ears and nose, no pharynx injection, no tonsillar enlargement.        Neck: No JVD, no bruit, no mass felt. Heme: No neck lymph node enlargement. Cardiac: S1/S2, RRR, No murmurs, No gallops or rubs. Respiratory: No rales, wheezing, rhonchi or rubs. GI: Soft, nondistended, nontender, no rebound pain, no organomegaly, BS present. GU: No hematuria Ext: No pitting leg edema bilaterally. 1+DP/PT pulse bilaterally. Musculoskeletal: No joint deformities, No joint redness or warmth, no limitation of ROM in spin. Skin: No rashes.  Neuro: Alert, oriented X3, cranial nerves II-XII grossly intact, moves all extremities normally. Psych:  Patient is not psychotic, no suicidal or hemocidal ideation.  Labs on Admission: I have personally reviewed following labs and imaging studies  CBC: Recent Labs  Lab 12/27/21 0243  WBC 7.9  NEUTROABS 5.1  HGB 15.1  HCT 45.7  MCV 90.0  PLT 735   Basic Metabolic Panel: Recent Labs  Lab 12/27/21 0243  NA 142  K 3.7  CL 103  CO2 28  GLUCOSE 146*  BUN 19  CREATININE 0.77  CALCIUM 9.2  MG 1.8   GFR: Estimated Creatinine Clearance: 127.5 mL/min (by C-G formula based on SCr of 0.77 mg/dL). Liver Function Tests: Recent Labs  Lab 12/27/21 0243  AST 47*  ALT 80*  ALKPHOS  54  BILITOT 0.5  PROT 7.4  ALBUMIN 3.9   Recent Labs  Lab 12/27/21 0243  LIPASE 43   No results for input(s): AMMONIA in the last 168 hours. Coagulation Profile: Recent Labs  Lab 12/27/21 0243  INR 0.9   Cardiac Enzymes: No results for input(s): CKTOTAL, CKMB, CKMBINDEX, TROPONINI in the last 168 hours. BNP (last 3 results) No results for input(s): PROBNP in the last 8760 hours. HbA1C: No results for input(s): HGBA1C in the last 72 hours. CBG: No results for input(s): GLUCAP in the last 168 hours. Lipid Profile: No results for input(s): CHOL, HDL, LDLCALC, TRIG, CHOLHDL, LDLDIRECT in the last 72 hours. Thyroid Function Tests: No results for input(s): TSH, T4TOTAL, FREET4, T3FREE, THYROIDAB in the last 72 hours. Anemia Panel: No results for input(s): VITAMINB12, FOLATE, FERRITIN, TIBC, IRON, RETICCTPCT in the last 72 hours. Urine analysis:    Component Value Date/Time   COLORURINE AMBER (A) 08/18/2017 0310   APPEARANCEUR TURBID (A) 08/18/2017 0310   LABSPEC 1.023 08/18/2017 0310   PHURINE 5.0 08/18/2017 0310   GLUCOSEU NEGATIVE 08/18/2017 0310   HGBUR NEGATIVE 08/18/2017 0310   BILIRUBINUR NEGATIVE 08/18/2017 0310   KETONESUR NEGATIVE 08/18/2017 0310   PROTEINUR NEGATIVE 08/18/2017 0310   NITRITE NEGATIVE 08/18/2017 0310   LEUKOCYTESUR NEGATIVE 08/18/2017 0310   Sepsis  Labs: @LABRCNTIP (procalcitonin:4,lacticidven:4) ) Recent Results (from the past 240 hour(s))  Resp Panel by RT-PCR (Flu A&B, Covid) Nasopharyngeal Swab     Status: None   Collection Time: 12/27/21  5:35 AM   Specimen: Nasopharyngeal Swab; Nasopharyngeal(NP) swabs in vial transport medium  Result Value Ref Range Status   SARS Coronavirus 2 by RT PCR NEGATIVE NEGATIVE Final    Comment: (NOTE) SARS-CoV-2 target nucleic acids are NOT DETECTED.  The SARS-CoV-2 RNA is generally detectable in upper respiratory specimens during the acute phase of infection. The lowest concentration of SARS-CoV-2 viral copies this assay can detect is 138 copies/mL. A negative result does not preclude SARS-Cov-2 infection and should not be used as the sole basis for treatment or other patient management decisions. A negative result may occur with  improper specimen collection/handling, submission of specimen other than nasopharyngeal swab, presence of viral mutation(s) within the areas targeted by this assay, and inadequate number of viral copies(<138 copies/mL). A negative result must be combined with clinical observations, patient history, and epidemiological information. The expected result is Negative.  Fact Sheet for Patients:  EntrepreneurPulse.com.au  Fact Sheet for Healthcare Providers:  IncredibleEmployment.be  This test is no t yet approved or cleared by the [place] FDA and  has been authorized for detection and/or diagnosis of SARS-CoV-2 by FDA under an Emergency Use Authorization (EUA). This EUA will remain  in effect (meaning this test can be used) for the duration of the COVID-19 declaration under Section 564(b)(1) of the Act, 21 U.S.C.section 360bbb-3(b)(1), unless the authorization is terminated  or revoked sooner.       Influenza A by PCR NEGATIVE NEGATIVE Final   Influenza B by PCR NEGATIVE NEGATIVE Final    Comment: (NOTE) The Xpert Xpress  SARS-CoV-2/FLU/RSV plus assay is intended as an aid in the diagnosis of influenza from Nasopharyngeal swab specimens and should not be used as a sole basis for treatment. Nasal washings and aspirates are unacceptable for Xpert Xpress SARS-CoV-2/FLU/RSV testing.  Fact Sheet for Patients: EntrepreneurPulse.com.au  Fact Sheet for Healthcare Providers: IncredibleEmployment.be  This test is not yet approved or cleared by the [place] and has been authorized for  detection and/or diagnosis of SARS-CoV-2 by FDA under an Emergency Use Authorization (EUA). This EUA will remain in effect (meaning this test can be used) for the duration of the COVID-19 declaration under Section 564(b)(1) of the Act, 21 U.S.C. section 360bbb-3(b)(1), unless the authorization is terminated or revoked.  Performed at [facility], [address], [place], [place] [address]      Radiological Exams on Admission: No results found.    Assessment/Plan Principal Problem:   Atrial flutter with rapid ventricular response (HCC) Active Problems:   Elevated troponin   CAD (coronary artery disease)   HLD (hyperlipidemia)   Prostate cancer (HCC)   Alcohol use   BPH (benign prostatic hyperplasia)  Atrial flutter with rapid ventricular response ([place]): HR was up to 245, improved to 110-120 after treated with metoprolol.  No active chest pain.   CHA2DS2-VASc Score is 1, dose not need long term anticoagulants. Dr. Oval Linsey of cardiology is consulted.   -Place in PCU for observation -Continue home metoprolol and flecainide -Give 40 mEq of potassium chloride (his potassium is 3.7) and 1 g of magnesium sulfate (his magnesium is 1.8)  Elevated troponin and and CAD (coronary artery disease): trop 21 --> 115. No chest pain.  Likely due to demand ischemia. -IV heparin started in ED -Trend troponin -Check A1c, FLP -Continue aspirin -Patient states that he is not taking  Crestor since it will cause joint pain  HLD (hyperlipidemia): Patient not taking Crestor currently -Follow-up FLP  Prostate cancer and BPH -Flomax  Alcohol use: -Data counseling about importance of quitting alcohol use -CIWA protocol      DVT ppx: on IV Heparin    Code Status: Full code  Family Communication: Yes, patient's  sister-in law  at bed side.     Disposition Plan:  Anticipate discharge back to previous environment  Consults called: Dr. Oval Linsey of cardiology  Admission status and Level of care: Progressive:    for obs     Severity of Illness:  The appropriate patient status for this patient is OBSERVATION. Observation status is judged to be reasonable and necessary in order to provide the required intensity of service to ensure the patient's safety. The patient's presenting symptoms, physical exam findings, and initial radiographic and laboratory data in the context of their medical condition is felt to place them at decreased risk for further clinical deterioration. Furthermore, it is anticipated that the patient will be medically stable for discharge from the hospital within 2 midnights of admission.        Date of Service 12/27/2021    Ivor Costa [facility]   If 7PM-7AM, please contact night-coverage [email or web address] 12/27/2021, 9:51 AM

## 2021-12-27 NOTE — ED Triage Notes (Signed)
Pt states he took 650mg  of ASA at home prior to EMS arrival

## 2021-12-27 NOTE — ED Provider Notes (Signed)
[facility] Provider Note    Event Date/Time   First MD Initiated Contact with Patient 12/27/21 0240     (approximate)   History   Chest Pain   HPI  Even Budlong is a 66 y.o. male whose medical history includes a flutter treated with metoprolol and flecainide and which is generally under good control.  He has a cardiologist that he sees in [place].  He does not take anticoagulation.  He presents tonight by EMS for evaluation of lightheadedness, some epigastric burning, and palpitations.  He reports that he was fast asleep tonight and then woke up to go the bathroom and he could tell that something was different.  He was lightheaded and thought he might pass out.  He was not having chest pain but possibly some chest pressure.  He felt a little bit dizzy.  He also felt like he had a little bit of acid reflux.  He checked his pulse oximeter which also has a heart rate monitor and his heart rate was listed as 244.  He called his neighbor who is an ED nurse and she told him to call 911.  When paramedics arrived they found that his heart rate was about 220 and the rhythm strip was reading as supraventricular tachycardia.  They called me for medical advice given that the patient has a history of a flutter but no history of SVT.  I recommended holding adenosine but giving a dose of metoprolol 5 mg IV since the patient is on metoprolol daily.  That dramatically improved the patient's heart rate and brought him down to about 120 which is approximate heart rate when he arrived to the ED.  He said he felt a lot better at that point.  He denies any recent illness.  He denies shortness of breath and fever.  He said he has been compliant with his medications.  He also admits that he drank a full pot of coffee earlier today.     Physical Exam   ED Triage Vitals [12/27/21 0250]  Enc Vitals Group     BP (!) 140/99     Pulse Rate (!) 115     Resp (!) 21     Temp      Temp  src      SpO2 96 %     Weight      Height      Head Circumference      Peak Flow      Pain Score      Pain Loc      Pain Edu?      Excl. in [place]?       Most recent vital signs: Vitals:   12/27/21 0330 12/27/21 0420  BP: 105/88 119/81  Pulse: (!) 120 (!) 113  Resp:  (!) 24  SpO2: 94% 92%     General: Awake, no distress.  CV:  Good peripheral perfusion.  Normal capillary refill.  Normal heart sounds.  Tachycardia with rate ranging from about 100-135. Resp:  Normal effort.  Lungs are clear to auscultation. Abd:  No distention.    ED Results / Procedures / Treatments   Labs (all labs ordered are listed, but only abnormal results are displayed) Labs Reviewed  COMPREHENSIVE METABOLIC PANEL - Abnormal; Notable for the following components:      Result Value   Glucose, Bld 146 (*)    AST 47 (*)    ALT 80 (*)    All other components  within normal limits  TROPONIN I (HIGH SENSITIVITY) - Abnormal; Notable for the following components:   Troponin I (High Sensitivity) 24 (*)    All other components within normal limits  TROPONIN I (HIGH SENSITIVITY) - Abnormal; Notable for the following components:   Troponin I (High Sensitivity) 115 (*)    All other components within normal limits  RESP PANEL BY RT-PCR (FLU A&B, COVID) ARPGX2  PROTIME-INR  APTT  CBC WITH DIFFERENTIAL/PLATELET  MAGNESIUM  LIPASE, BLOOD     EKG  ED ECG REPORT I, Hinda Kehr, the attending physician, personally viewed and interpreted this ECG.  Date: 12/27/2021 EKG Time: 2:51 AM Rate: 116 Rhythm: sinus tachycardia versus a flutter with RVR QRS Axis: normal Intervals: normal ST/T Wave abnormalities: Non-specific ST segment / T-wave changes, but no clear evidence of acute ischemia. Narrative Interpretation: no definitive evidence of acute ischemia; does not meet STEMI criteria.   PROCEDURES:  Critical Care performed: Yes, see critical care procedure note(s)  .1-3 Lead EKG  Interpretation Performed by: Hinda Kehr, MD Authorized by: Hinda Kehr, MD     Interpretation: abnormal     ECG rate:  120   ECG rate assessment: tachycardic     Rhythm: atrial flutter     Ectopy: none     Conduction: normal   .Critical Care Performed by: Hinda Kehr, MD Authorized by: Hinda Kehr, MD   Critical care provider statement:    Critical care time (minutes):  45   Critical care time was exclusive of:  Separately billable procedures and treating other patients   Critical care was necessary to treat or prevent imminent or life-threatening deterioration of the following conditions:  Cardiac failure and circulatory failure   Critical care was time spent personally by me on the following activities:  Development of treatment plan with patient or surrogate, evaluation of patient's response to treatment, examination of patient, obtaining history from patient or surrogate, ordering and performing treatments and interventions, ordering and review of laboratory studies, ordering and review of radiographic studies, pulse oximetry, re-evaluation of patient's condition and review of old charts   MEDICATIONS ORDERED IN ED: Medications  aspirin chewable tablet 324 mg (324 mg Oral Not Given 12/27/21 0615)  heparin ADULT infusion 100 units/mL (25000 units/232mL) (1,500 Units/hr Intravenous New Bag/Given 12/27/21 0641)  metoprolol tartrate (LOPRESSOR) tablet 25 mg (25 mg Oral Given 12/27/21 0310)  lactated ringers bolus 1,000 mL (1,000 mLs Intravenous New Bag/Given 12/27/21 0512)  metoprolol tartrate (LOPRESSOR) injection 5 mg (5 mg Intravenous Given 12/27/21 0525)  heparin bolus via infusion 6,000 Units (6,000 Units Intravenous Bolus from Bag 12/27/21 0641)     IMPRESSION / MDM / [place] / ED COURSE  I reviewed the triage vital signs and the nursing notes.                              Differential diagnosis includes, but is not limited to, a flutter, A-fib, SVT, other  nonspecific arrhythmia, ACS.  The patient is on the cardiac monitor to evaluate for evidence of arrhythmia and/or significant heart rate changes.  Patient's heart rate was initially exceedingly high and he was symptomatic.  I was able to see the rhythm strip from EMS and verified that his heart rate in the field was 222.  It was being interpreted by the computer as SVT but I believe it was a flutter with RVR.  1 dose of IV metoprolol brought it down  to the 120s and the EKG is interpreting it as sinus tachycardia but again I believe that is more likely to be underlying a flutter.  I ordered a dose of metoprolol tartrate 25 mg by mouth and ordered labs including CBC with differential, coagulation studies, lipase, CMP, and high-sensitivity troponin.  And magnesium.  We will continue to observe and monitor.  Clinical Course as of 12/27/21 0641  Sat Dec 27, 2021  0509 Patient's heart rate remains elevated, likely A-fib.  He is most consistently right around 115 but sometimes he will jump to the 130s and then back down to around 110.  I ordered LR 1 L bolus to see if this helps.  I am getting a repeat EKG and then will likely try another dose of medication.  The patient says that he is a little bit worried about the way he is feeling and what his heart rate is doing because this is atypical.  I think he would likely benefit from hospital observation/inpatient and cardiology consultation but I will reassess after repeat EKG, repeat high-sensitivity troponin, and another dose of medication. [CF]  6712 Patient's repeat EKG does demonstrate atrial flutter with predominant 2 1 AV block. [CF]  [id number] I had just come back to reassess the patient and talk with him.  He still does not feel exactly right and his heart rate remains elevated in the 110s and a flutter, and he said he is not usually in a flutter.  We discussed admission versus discharge and he is worried about going home given that his symptoms are relatively  acute and were quite severe earlier and he does not feel exactly right.  His vital signs are still not normal either.  In the meantime, his second troponin came back at 115.  I strongly suspect this represents demand ischemia in the setting of his uncontrolled heart rate, verified to be at least as high as 233 on the rhythm strip by EMS.  However there is also possibility of NSTEMI (a primary cardiac issue/ACS).  Given his dysrhythmia and elevated troponin, I am starting him on heparin bolus plus infusion.  I will also give a full dose aspirin.  Patient agrees with the plan for admission for his a flutter with RVR and elevated troponin/demand ischemia. [CF]  0604 I consulted Dr. Sidney Ace with the hospitalist service.  I discussed the case in detail via secure chat message and he agrees with the plan for admission. [CF]    Clinical Course User Index [CF] Hinda Kehr, MD     FINAL CLINICAL IMPRESSION(S) / ED DIAGNOSES   Final diagnoses:  Atrial flutter with rapid ventricular response ([place])  Elevated troponin level  Demand ischemia ([place])     Rx / DC Orders   ED Discharge Orders     None        Note:  This document was prepared using Dragon voice recognition software and may include unintentional dictation errors.   Hinda Kehr, MD 12/27/21 [phone]

## 2021-12-27 NOTE — ED Notes (Signed)
Pt states he only drinks 1-2 beers a night. Denies any CIWA symptoms. Has never had issues when he doesn't drink. Messaged MD about CIWA criteria.

## 2021-12-27 NOTE — Progress Notes (Signed)
Patient arrived to unit, oriented to unit and placed on bedside cardiac monitor. Educated on safety precautions. Admission unable to be completed, will pass along to night RN Marcene Brawn.

## 2021-12-27 NOTE — Progress Notes (Signed)
ANTICOAGULATION CONSULT NOTE  Pharmacy Consult for heparin infusion Indication: A-Fib (A-flutter w/ RVR and elevated troponin)  Allergies  Allergen Reactions   Morphine Nausea Only    Patient Measurements: Height: 5\' 11"  (180.3 cm) Weight: 128.5 kg (283 lb 4.7 oz) IBW/kg (Calculated) : 75.3 Heparin Dosing Weight: 104.4 kg  Vital Signs: Temp: 98.2 F (36.8 C) (02/11 1400) Temp Source: Oral (02/11 1400) BP: 110/88 (02/11 1400) Pulse Rate: 121 (02/11 1400)  Labs: Recent Labs    12/27/21 0243 12/27/21 0507 12/27/21 0740 12/27/21 0930 12/27/21 1428  HGB 15.1  --   --   --   --   HCT 45.7  --   --   --   --   PLT 179  --   --   --   --   APTT 30  --   --   --   --   LABPROT 12.6  --   --   --   --   INR 0.9  --   --   --   --   HEPARINUNFRC  --   --   --   --  0.17*  CREATININE 0.77  --   --   --   --   TROPONINIHS 24* 115* 103* 74*  --      Estimated Creatinine Clearance: 127.5 mL/min (by C-G formula based on SCr of 0.77 mg/dL).   Medical History: Past Medical History:  Diagnosis Date   Atrial flutter ([place])    CAD (coronary artery disease)    Cancer (HCC)    Prostate   HLD (hyperlipidemia)     Assessment: Pt is 66 yo male with h/o A-fib presenting to ED c/o being woke up by sudden onset of chest discomfort, found with A-flutter and slightly elevated Troponin I.  Goal of Therapy:  Heparin level 0.3-0.7 units/ml Monitor platelets by anticoagulation protocol: Yes   Plan:  02/11@1428 : HL 0.17, subtherapeutic Bolus 3100 units x 1 Increase heparin infusion to 1800 units/hr Check HL in 6 hrs after rate change CBC daily while on heparin.  Pearla Dubonnet, PharmD Clinical Pharmacist 12/27/2021 2:55 PM

## 2021-12-27 NOTE — Progress Notes (Signed)
ANTICOAGULATION CONSULT NOTE  Pharmacy Consult for heparin infusion Indication: A-Fib (A-flutter w/ RVR and elevated troponin)  Allergies  Allergen Reactions   Morphine Nausea Only    Patient Measurements:   Heparin Dosing Weight: 104.4 kg  Vital Signs: BP: 119/81 (02/11 0420) Pulse Rate: 113 (02/11 0420)  Labs: Recent Labs    12/27/21 0243 12/27/21 0507  HGB 15.1  --   HCT 45.7  --   PLT 179  --   APTT 30  --   LABPROT 12.6  --   INR 0.9  --   CREATININE 0.77  --   TROPONINIHS 24* 115*    CrCl cannot be calculated (Unknown ideal weight.).   Medical History: Past Medical History:  Diagnosis Date   Cancer [facility])    Prostate    Assessment: Pt is 66 yo male with h/o A-fib presenting to ED c/o being woke up by sudden onset of chest discomfort, found with A-flutter and slightly elevated Troponin I.  Goal of Therapy:  Heparin level 0.3-0.7 units/ml Monitor platelets by anticoagulation protocol: Yes   Plan:  Bolus 6000 units x 1 Start heparin infusion at 1500 units/hr Check HL in 6 hrs after start of infusion CBC daily while on heparin.  Renda Rolls, PharmD, [facility] 12/27/2021 6:08 AM

## 2021-12-28 ENCOUNTER — Encounter: Payer: Self-pay | Admitting: Internal Medicine

## 2021-12-28 ENCOUNTER — Other Ambulatory Visit: Payer: Self-pay

## 2021-12-28 ENCOUNTER — Observation Stay (HOSPITAL_COMMUNITY)
Admit: 2021-12-28 | Discharge: 2021-12-28 | Disposition: A | Discharge status: Home / Self Care | Payer: No Typology Code available for payment source | Attending: Cardiovascular Disease | Admitting: Cardiovascular Disease

## 2021-12-28 DIAGNOSIS — I4891 Unspecified atrial fibrillation: Secondary | ICD-10-CM

## 2021-12-28 DIAGNOSIS — F1090 Alcohol use, unspecified, uncomplicated: Secondary | ICD-10-CM | POA: Diagnosis present

## 2021-12-28 DIAGNOSIS — Z8546 Personal history of malignant neoplasm of prostate: Secondary | ICD-10-CM | POA: Diagnosis not present

## 2021-12-28 DIAGNOSIS — Z833 Family history of diabetes mellitus: Secondary | ICD-10-CM | POA: Diagnosis not present

## 2021-12-28 DIAGNOSIS — I361 Nonrheumatic tricuspid (valve) insufficiency: Secondary | ICD-10-CM | POA: Diagnosis not present

## 2021-12-28 DIAGNOSIS — N400 Enlarged prostate without lower urinary tract symptoms: Secondary | ICD-10-CM | POA: Diagnosis present

## 2021-12-28 DIAGNOSIS — Z791 Long term (current) use of non-steroidal anti-inflammatories (NSAID): Secondary | ICD-10-CM | POA: Diagnosis not present

## 2021-12-28 DIAGNOSIS — Z885 Allergy status to narcotic agent status: Secondary | ICD-10-CM | POA: Diagnosis not present

## 2021-12-28 DIAGNOSIS — Z6839 Body mass index (BMI) 39.0-39.9, adult: Secondary | ICD-10-CM | POA: Diagnosis not present

## 2021-12-28 DIAGNOSIS — Z87891 Personal history of nicotine dependence: Secondary | ICD-10-CM | POA: Diagnosis not present

## 2021-12-28 DIAGNOSIS — I429 Cardiomyopathy, unspecified: Secondary | ICD-10-CM | POA: Diagnosis present

## 2021-12-28 DIAGNOSIS — I2510 Atherosclerotic heart disease of native coronary artery without angina pectoris: Secondary | ICD-10-CM | POA: Diagnosis present

## 2021-12-28 DIAGNOSIS — E669 Obesity, unspecified: Secondary | ICD-10-CM | POA: Diagnosis present

## 2021-12-28 DIAGNOSIS — I248 Other forms of acute ischemic heart disease: Secondary | ICD-10-CM | POA: Diagnosis present

## 2021-12-28 DIAGNOSIS — I440 Atrioventricular block, first degree: Secondary | ICD-10-CM | POA: Diagnosis present

## 2021-12-28 DIAGNOSIS — Z79899 Other long term (current) drug therapy: Secondary | ICD-10-CM | POA: Diagnosis not present

## 2021-12-28 DIAGNOSIS — I081 Rheumatic disorders of both mitral and tricuspid valves: Secondary | ICD-10-CM | POA: Diagnosis present

## 2021-12-28 DIAGNOSIS — Z20822 Contact with and (suspected) exposure to covid-19: Secondary | ICD-10-CM | POA: Diagnosis present

## 2021-12-28 DIAGNOSIS — I471 Supraventricular tachycardia: Secondary | ICD-10-CM | POA: Diagnosis present

## 2021-12-28 DIAGNOSIS — Z789 Other specified health status: Secondary | ICD-10-CM | POA: Diagnosis not present

## 2021-12-28 DIAGNOSIS — E785 Hyperlipidemia, unspecified: Secondary | ICD-10-CM | POA: Diagnosis present

## 2021-12-28 DIAGNOSIS — I4892 Unspecified atrial flutter: Secondary | ICD-10-CM | POA: Diagnosis present

## 2021-12-28 DIAGNOSIS — R778 Other specified abnormalities of plasma proteins: Secondary | ICD-10-CM

## 2021-12-28 DIAGNOSIS — Z7982 Long term (current) use of aspirin: Secondary | ICD-10-CM | POA: Diagnosis not present

## 2021-12-28 DIAGNOSIS — I493 Ventricular premature depolarization: Secondary | ICD-10-CM | POA: Diagnosis present

## 2021-12-28 DIAGNOSIS — I340 Nonrheumatic mitral (valve) insufficiency: Secondary | ICD-10-CM | POA: Diagnosis not present

## 2021-12-28 DIAGNOSIS — Z923 Personal history of irradiation: Secondary | ICD-10-CM | POA: Diagnosis not present

## 2021-12-28 DIAGNOSIS — G4733 Obstructive sleep apnea (adult) (pediatric): Secondary | ICD-10-CM | POA: Diagnosis present

## 2021-12-28 LAB — ECHOCARDIOGRAM COMPLETE
AR max vel: 2.7 cm2
AV Area VTI: 2.39 cm2
AV Area mean vel: 2.36 cm2
AV Mean grad: 2.5 mmHg
AV Peak grad: 4 mmHg
Ao pk vel: 1 m/s
Area-P 1/2: 4.93 cm2
Calc EF: 54.7 %
Height: 71 in
Single Plane A2C EF: 53.8 %
Single Plane A4C EF: 59.7 %
Weight: 4532.66 oz

## 2021-12-28 LAB — LIPID PANEL
Cholesterol: 204 mg/dL — ABNORMAL HIGH (ref 0–200)
HDL: 39 mg/dL — ABNORMAL LOW (ref >40)
LDL Cholesterol: 128 mg/dL — ABNORMAL HIGH (ref 0–99)
Total CHOL/HDL Ratio: 5.2 RATIO
Triglycerides: 185 mg/dL — ABNORMAL HIGH (ref <150)
VLDL: 37 mg/dL (ref 0–40)

## 2021-12-28 LAB — HEPARIN LEVEL (UNFRACTIONATED)
Heparin Unfractionated: 0.31 IU/mL (ref 0.30–0.70)
Heparin Unfractionated: 0.28 IU/mL — ABNORMAL LOW (ref 0.30–0.70)
Heparin Unfractionated: 0.45 IU/mL (ref 0.30–0.70)

## 2021-12-28 LAB — HIV ANTIBODY (ROUTINE TESTING W REFLEX): HIV Screen 4th Generation wRfx: NONREACTIVE

## 2021-12-28 MED ORDER — ROSUVASTATIN CALCIUM 10 MG PO TABS
20.0000 mg | ORAL_TABLET | Freq: Every day | ORAL | Status: DC
  Administered 2021-12-28 – 2021-12-30 (×3): 20 mg via ORAL
  Filled 2021-12-28 (×3): qty 2

## 2021-12-28 MED ORDER — HEPARIN BOLUS VIA INFUSION
1600.0000 [IU] | Freq: Once | INTRAVENOUS | Status: AC
  Administered 2021-12-28: 1600 [IU] via INTRAVENOUS
  Filled 2021-12-28: qty 1600

## 2021-12-28 MED ORDER — PERFLUTREN LIPID MICROSPHERE
1.0000 mL | INTRAVENOUS | Status: AC | PRN
  Administered 2021-12-28: 2 mL via INTRAVENOUS
  Filled 2021-12-28: qty 10

## 2021-12-28 NOTE — Progress Notes (Signed)
ANTICOAGULATION CONSULT NOTE  Pharmacy Consult for heparin infusion Indication: A-Fib (A-flutter w/ RVR and elevated troponin)  Allergies  Allergen Reactions   Morphine Nausea Only    Patient Measurements: Height: 5\' 11"  (180.3 cm) Weight: 128.5 kg (283 lb 4.7 oz) IBW/kg (Calculated) : 75.3 Heparin Dosing Weight: 104.4 kg  Vital Signs: Temp: 98.3 F (36.8 C) (02/12 0400) Temp Source: Oral (02/12 0400) BP: 108/75 (02/12 0400) Pulse Rate: 112 (02/12 0400)  Labs: Recent Labs    12/27/21 0243 12/27/21 0507 12/27/21 0740 12/27/21 0930 12/27/21 1428 12/27/21 2025 12/28/21 0236  HGB 15.1  --   --   --   --   --   --   HCT 45.7  --   --   --   --   --   --   PLT 179  --   --   --   --   --   --   APTT 30  --   --   --   --   --   --   LABPROT 12.6  --   --   --   --   --   --   INR 0.9  --   --   --   --   --   --   HEPARINUNFRC  --   --   --   --  0.17* 0.38 0.31  CREATININE 0.77  --   --   --   --   --   --   TROPONINIHS 24* 115* 103* 74*  --   --   --      Estimated Creatinine Clearance: 127.5 mL/min (by C-G formula based on SCr of 0.77 mg/dL).   Medical History: Past Medical History:  Diagnosis Date   Atrial flutter (HCC)    CAD (coronary artery disease)    Cancer (HCC)    Prostate   HLD (hyperlipidemia)     Assessment: Pt is 66 yo male with h/o A-fib presenting to ED c/o being woke up by sudden onset of chest discomfort, found with A-flutter and slightly elevated Troponin I.  Date Time HL Rate/comment 2/11 1428 0.17 1500 un/hr, subthera 2/11 2025 0.38 1800 un/hr, therapeutic 2/12 0236 0.31 Slightly therapeutic, trending down  Goal of Therapy:  Heparin level 0.3-0.7 units/ml Monitor platelets by anticoagulation protocol: Yes   Plan:  Will increase heparin infusion to 1800 units/hr Check HL in 6 hr to reconfirm, then daily CBC daily while on heparin  Renda Rolls, PharmD, [facility] 12/28/2021 4:37 AM

## 2021-12-28 NOTE — Progress Notes (Signed)
ANTICOAGULATION CONSULT NOTE  Pharmacy Consult for heparin infusion Indication: A-Fib (A-flutter w/ RVR and elevated troponin)  Allergies  Allergen Reactions   Morphine Nausea Only    Patient Measurements: Height: 5\' 11"  (180.3 cm) Weight: 128.5 kg (283 lb 4.7 oz) IBW/kg (Calculated) : 75.3 Heparin Dosing Weight: 104.4 kg  Vital Signs: Temp: 98.6 F (37 C) (02/12 1100) Temp Source: Oral (02/12 0400) BP: 107/81 (02/12 1100) Pulse Rate: 123 (02/12 1100)  Labs: Recent Labs    12/27/21 0243 12/27/21 0507 12/27/21 0740 12/27/21 0930 12/27/21 1428 12/27/21 2025 12/28/21 0236 12/28/21 1154  HGB 15.1  --   --   --   --   --   --   --   HCT 45.7  --   --   --   --   --   --   --   PLT 179  --   --   --   --   --   --   --   APTT 30  --   --   --   --   --   --   --   LABPROT 12.6  --   --   --   --   --   --   --   INR 0.9  --   --   --   --   --   --   --   HEPARINUNFRC  --   --   --   --    < > 0.38 0.31 0.28*  CREATININE 0.77  --   --   --   --   --   --   --   TROPONINIHS 24* 115* 103* 74*  --   --   --   --    < > = values in this interval not displayed.     Estimated Creatinine Clearance: 127.5 mL/min (by C-G formula based on SCr of 0.77 mg/dL).   Medical History: Past Medical History:  Diagnosis Date   Atrial flutter (HCC)    CAD (coronary artery disease)    Cancer (HCC)    Prostate   HLD (hyperlipidemia)     Assessment: Pt is 66 yo male with h/o A-fib presenting to ED c/o being woke up by sudden onset of chest discomfort, found with A-flutter and slightly elevated Troponin I.  Date Time HL Rate/comment 2/11 1428 0.17 1500 un/hr, subthera 2/11 2025 0.38 1800 un/hr, therapeutic 2/12 0236 0.31 Slightly therapeutic,downward trend  Goal of Therapy:  Heparin level 0.3-0.7 units/ml Monitor platelets by anticoagulation protocol: Yes   Plan:  2/12@1154 : 0.28, subtherapeutic Bolus 1600 units x 1 Will increase heparin infusion to 2100 units/hr Check HL  in 6 hr to reconfirm, then daily CBC daily while on heparin  Pearla Dubonnet, PharmD Clinical Pharmacist 12/28/2021 12:57 PM

## 2021-12-28 NOTE — Progress Notes (Signed)
[place] [facility] [place] NAME: Stephen Sherman    MR#:  [id number]  DATE OF BIRTH:  Sep 06, 1956  SUBJECTIVE:  patient came in with elevated heart rate indicated by his Apple Watch. Denies any chest pain. Has history of atrial flutter/fibrillation. Heart rate ranging from 99 to 120. Currently on IV diltiazem drip and IV heparin drip. Overall rate blocking agents have been added by cardiology.\ Family at bedside. Patient denies any chest pain.    VITALS:  Blood pressure 107/81, pulse (!) 123, temperature 98.6 F (37 C), resp. rate (!) 22, height 5\' 11"  (1.803 m), weight 128.5 kg, SpO2 96 %.  PHYSICAL EXAMINATION:   GENERAL:  66 y.o.-year-old patient lying in the bed with no acute distress. Obesity LUNGS: Normal breath sounds bilaterally, no wheezing, rales, rhonchi.  CARDIOVASCULAR: S1, S2 normal. No murmurs, rubs, or gallops. Tachycardia+ ABDOMEN: Soft, nontender, nondistended. Bowel sounds present.  EXTREMITIES: No  edema b/l.    NEUROLOGIC: nonfocal  patient is alert and awake   LABORATORY PANEL:  CBC Recent Labs  Lab 12/27/21 0243  WBC 7.9  HGB 15.1  HCT 45.7  PLT 179    Chemistries  Recent Labs  Lab 12/27/21 0243  NA 142  K 3.7  CL 103  CO2 28  GLUCOSE 146*  BUN 19  CREATININE 0.77  CALCIUM 9.2  MG 1.8  AST 47*  ALT 80*  ALKPHOS 54  BILITOT 0.5   Cardiac Enzymes No results for input(s): TROPONINI in the last 168 hours. RADIOLOGY:  No results found.  Assessment and Plan Stephen Sherman is a 66 y.o. male with medical history significant of A flutter, HLD, CAD, prostate cancer,  alcohol use, who presents with palpitation.   Patient states that his symptoms started in the middle night, described as palpitation, chest discomfort with burning sensation, lightheadedness.  Denies active chest pain, cough, shortness of breath. He checked his pulse oximeter which also has a heart rate monitor and his heart rate was listed as  245  Atrial flutter with rapid ventricular response ([place]): HR was up to 245, improved to 110-120 after treated with metoprolol.  No active chest pain.   --CHA2DS2-VASc Score is 1 --Dr. Oval Linsey of cardiology input appreciated. Patient is on Cardizem drip, IV heparin drip, PO metoprolol and PO flecainide. Does being adjusted by cardiology. Patient may need cardioversion.  -- Electrolytes being repleted  Elevated troponin and and CAD (coronary artery disease): trop 21 --> 115. No chest pain.  Likely due to demand ischemia. -IV heparin started in ED --Continue aspirin -Patient states that he is not taking Crestor since it will cause joint pain   HLD (hyperlipidemia): Patient not taking Crestor currently   Prostate cancer and BPH -Flomax   Alcohol use: -CIWA protocol     DVT ppx: on IV Heparin     Code Status: Full code   Family Communication: none today      Disposition Plan:  Anticipate discharge back to previous environment   Consults called: Dr. Oval Linsey of cardiology   Admission status and Level of care: Progressive:    for obs    Family communication :none Consults :[facility] cardiology CODE STATUS: Full code DVT Prophylaxis :heparin Level of care: Progressive Status is: Inpatient Remains inpatient appropriate because: Atrial flutter/fib            TOTAL TIME TAKING CARE OF THIS PATIENT: 30 minutes.  >50% time spent on counselling and coordination of care  Note:  This dictation was prepared with Dragon dictation along with smaller phrase technology. Any transcriptional errors that result from this process are unintentional.  Stephen Sherman M.D    [facility]   CC: Primary care physician; Stephen Billet, MD

## 2021-12-28 NOTE — Progress Notes (Signed)
°   12/27/21 1839  Assess: MEWS Score  Temp 98.8 F (37.1 C)  BP 91/64  Pulse Rate (!) 117  ECG Heart Rate (!) 122  Resp 16  Level of Consciousness Alert  SpO2 95 %  O2 Device Room Air  Assess: MEWS Score  MEWS Temp 0  MEWS Systolic 1  MEWS Pulse 2  MEWS RR 0  MEWS LOC 0  MEWS Score 3  MEWS Score Color Yellow  Assess: if the MEWS score is Yellow or Red  Were vital signs taken at a resting state? Yes  Focused Assessment No change from prior assessment  Does the patient meet 2 or more of the SIRS criteria? No  MEWS guidelines implemented *See Row Information* Yes  Treat  MEWS Interventions Escalated (See documentation below)  Take Vital Signs  Increase Vital Sign Frequency  Yellow: Q 2hr X 2 then Q 4hr X 2, if remains yellow, continue Q 4hrs  Escalate  MEWS: Escalate Yellow: discuss with charge nurse/RN and consider discussing with provider and RRT  Notify: Charge Nurse/RN  Name of Charge Nurse/RN Notified Linard Millers RN  Date Charge Nurse/RN Notified 12/28/21  Time Charge Nurse/RN Notified 1845  Assess: SIRS CRITERIA  SIRS Temperature  0  SIRS Pulse 1  SIRS Respirations  0  SIRS WBC 0  SIRS Score Sum  1

## 2021-12-28 NOTE — Progress Notes (Signed)
Attempted to obtain informed consent for transesophageal echocardiogram and direct current cardioversion, but patient stated that the cardiology provider did not really explain procedure and risk/benefits so did not feel comfortable signing consent.  Explained that it was understandable and that a provider would be by in the morning to explain procedure.  Will pass along to night RN to pass along to daytime RN on 2/13.

## 2021-12-28 NOTE — Progress Notes (Signed)
Progress Note  Patient Name: Stephen Sherman Date of Encounter: 12/28/2021  [facility] Cardiologist: Nelva Bush, MD   Subjective   Feeling well.  Denies palpitations, chest pain, or shortness of breath.  Inpatient Medications    Scheduled Meds:  aspirin  324 mg Oral Once   aspirin EC  81 mg Oral Daily   flecainide  75 mg Oral T24P   folic acid  1 mg Oral Daily   LORazepam  0-4 mg Intravenous Q6H   Followed by   Derrill Memo ON 12/29/2021] LORazepam  0-4 mg Intravenous Q12H   metoprolol tartrate  12.5 mg Oral BID   multivitamin with minerals  1 tablet Oral Daily   tamsulosin  0.4 mg Oral Daily   thiamine  100 mg Oral Daily   Or   thiamine  100 mg Intravenous Daily   Continuous Infusions:  diltiazem (CARDIZEM) infusion 10 mg/hr (12/28/21 0528)   heparin 1,900 Units/hr (12/28/21 0701)   PRN Meds: acetaminophen, LORazepam **OR** LORazepam, metoprolol tartrate, ondansetron (ZOFRAN) IV   Vital Signs    Vitals:   12/28/21 0400 12/28/21 0809 12/28/21 1049 12/28/21 1100  BP: 108/75 100/88 118/88 107/81  Pulse: (!) 112 (!) 126 (!) 120 (!) 123  Resp: 17 20 (!) 22   Temp: 98.3 F (36.8 C) 98 F (36.7 C)  98.6 F (37 C)  TempSrc: Oral     SpO2: 97% 96%  96%  Weight:      Height:        Intake/Output Summary (Last 24 hours) at 12/28/2021 1143 Last data filed at 12/28/2021 1055 Gross per 24 hour  Intake 810.31 ml  Output 2200 ml  Net -1389.69 ml   Last 3 Weights 12/27/2021 01/24/2018 08/18/2017  Weight (lbs) 283 lb 4.7 oz 276 lb 276 lb  Weight (kg) 128.5 kg 125.193 kg 125.193 kg      Telemetry    Atrial flutter.  Rate 100s to 120s.- Personally Reviewed  ECG    Atrial flutter.  Rate 116.  2-1 AV block. - Personally Reviewed  Physical Exam   GEN: No acute distress.   VS:  BP 107/81 (BP Location: Left Arm)    Pulse (!) 123    Temp 98.6 F (37 C)    Resp (!) 22    Ht 5\' 11"  (1.803 m)    Wt 128.5 kg    SpO2 96%    BMI 39.51 kg/m  , BMI Body mass index is 39.51  kg/m. GENERAL:  Well appearing HEENT: Pupils equal round and reactive, fundi not visualized, oral mucosa unremarkable NECK:  No jugular venous distention, waveform within normal limits, carotid upstroke brisk and symmetric, no bruits, no thyromegaly LUNGS:  Clear to auscultation bilaterally HEART: Tachycardic.  Regular rhythm PMI not displaced or sustained,S1 and S2 within normal limits, no S3, no S4, no clicks, no rubs, no murmurs ABD:  Flat, positive bowel sounds normal in frequency in pitch, no bruits, no rebound, no guarding, no midline pulsatile mass, no hepatomegaly, no splenomegaly EXT:  2 plus pulses throughout, no edema, no cyanosis no clubbing SKIN:  No rashes no nodules NEURO:  Cranial nerves II through XII grossly intact, motor grossly intact throughout PSYCH:  Cognitively intact, oriented to person place and time   Labs    High Sensitivity Troponin:   Recent Labs  Lab 12/27/21 0243 12/27/21 0507 12/27/21 0740 12/27/21 0930  TROPONINIHS 24* 115* 103* 74*     Chemistry Recent Labs  Lab 12/27/21 0243  NA 142  K 3.7  CL 103  CO2 28  GLUCOSE 146*  BUN 19  CREATININE 0.77  CALCIUM 9.2  MG 1.8  PROT 7.4  ALBUMIN 3.9  AST 47*  ALT 80*  ALKPHOS 54  BILITOT 0.5  GFRNONAA >60  ANIONGAP 11    Lipids  Recent Labs  Lab 12/28/21 0236  CHOL 204*  TRIG 185*  HDL 39*  LDLCALC 128*  CHOLHDL 5.2    Hematology Recent Labs  Lab 12/27/21 0243  WBC 7.9  RBC 5.08  HGB 15.1  HCT 45.7  MCV 90.0  MCH 29.7  MCHC 33.0  RDW 14.2  PLT 179   Thyroid No results for input(s): TSH, FREET4 in the last 168 hours.  BNPNo results for input(s): BNP, PROBNP in the last 168 hours.  DDimer No results for input(s): DDIMER in the last 168 hours.   Radiology    No results found.  Cardiac Studies   Echo pending.  Patient Profile     Stephen Sherman is a 66 y.o. male with a hx of non-obstructive CAD, hyperlipidemia, OSA, paroxysmal atrial flutter, prostate cancer  who is  being seen 12/27/2021 for the evaluation of atrial flutter.  Assessment & Plan    # Atrial flutter:  # Likely OSA:  He remains in atrial flutter.  Prior TEE/DCCV 11/2019.  He has done well on flecainide in the interim.  Remains poorly controlled on a diltiazem drip.  I did increase his flecainide to 75 mg and added metoprolol.  Blood pressure is too low for further titration.  Echo is pending.  If his systolic function is normal we will plan to add oral anticoagulation and get TEE/cardioversion tomorrow.  Continue IV heparin for now in case he needs a heart cath.  We discussed limiting EtOH and caffeine.  He needs a sleep study as an outpatient.  This patients CHA2DS2-VASc Score and unadjusted Ischemic Stroke Rate (% per year) is equal to 0.6 % stroke rate/year from a score of 1   Above score calculated as 1 point each if present [CHF, HTN, DM, Vascular=MI/PAD/Aortic Plaque, Age if 65-74, or Male] Above score calculated as 2 points each if present [Age > 75, or Stroke/TIA/TE]   # Elevated troponin:  # CAD:  # Hyperlipidemia:  He has no chest pain.  Likely demand ischemia.  Medical management unless he has recurrent chest discomfort after rate control.  LDL goal <70.  His LDL was 128.  Increase rosuvastatin to 20.  Repeat lipids and a CMP in 2 to 3 months.      For questions or updates, please contact [place] Please consult [email or web address] for contact info under        Signed, Skeet Latch, MD  12/28/2021, 11:43 AM

## 2021-12-28 NOTE — Progress Notes (Signed)
ANTICOAGULATION CONSULT NOTE  Pharmacy Consult for heparin infusion Indication: A-Fib (A-flutter w/ RVR and elevated troponin)  Allergies  Allergen Reactions   Morphine Nausea Only    Patient Measurements: Height: 5\' 11"  (180.3 cm) Weight: 128.5 kg (283 lb 4.7 oz) IBW/kg (Calculated) : 75.3 Heparin Dosing Weight: 104.4 kg  Vital Signs: Temp: 98.3 F (36.8 C) (02/12 1926) Temp Source: Oral (02/12 1500) BP: 107/57 (02/12 1926) Pulse Rate: 104 (02/12 1926)  Labs: Recent Labs    12/27/21 0243 12/27/21 0507 12/27/21 0740 12/27/21 0930 12/27/21 1428 12/28/21 0236 12/28/21 1154 12/28/21 1913  HGB 15.1  --   --   --   --   --   --   --   HCT 45.7  --   --   --   --   --   --   --   PLT 179  --   --   --   --   --   --   --   APTT 30  --   --   --   --   --   --   --   LABPROT 12.6  --   --   --   --   --   --   --   INR 0.9  --   --   --   --   --   --   --   HEPARINUNFRC  --   --   --   --    < > 0.31 0.28* 0.45  CREATININE 0.77  --   --   --   --   --   --   --   TROPONINIHS 24* 115* 103* 74*  --   --   --   --    < > = values in this interval not displayed.     Estimated Creatinine Clearance: 127.5 mL/min (by C-G formula based on SCr of 0.77 mg/dL).   Medical History: Past Medical History:  Diagnosis Date   Atrial flutter (HCC)    CAD (coronary artery disease)    Cancer (HCC)    Prostate   HLD (hyperlipidemia)     Assessment: Pt is 66 yo male with h/o A-fib presenting to ED c/o being woke up by sudden onset of chest discomfort, found with A-flutter and slightly elevated Troponin I.  Date Time HL Rate/comment 2/11 1428 0.17 1500 un/hr, subthera 2/11 2025 0.38 1800 un/hr, therapeutic 2/12 0236 0.31 Slightly therapeutic,downward trend 2/12 1154 0.28 Subtherapeutic 2/12 1913 0.45 2100 un/hr, therapeutic  Goal of Therapy:  Heparin level 0.3-0.7 units/ml Monitor platelets by anticoagulation protocol: Yes   Plan:  Heparin level 0.45, therapeutic   Continue heparin infusion at 2100 units/hr Check confirmatory HL in 6 hrs CBC daily while on heparin  Darnelle Bos, PharmD Clinical Pharmacist 12/28/2021 7:50 PM

## 2021-12-29 DIAGNOSIS — I4892 Unspecified atrial flutter: Secondary | ICD-10-CM | POA: Diagnosis not present

## 2021-12-29 LAB — BASIC METABOLIC PANEL
Anion gap: 8 (ref 5–15)
BUN: 16 mg/dL (ref 8–23)
CO2: 25 mmol/L (ref 22–32)
Calcium: 8.8 mg/dL — ABNORMAL LOW (ref 8.9–10.3)
Chloride: 103 mmol/L (ref 98–111)
Creatinine, Ser: 0.59 mg/dL — ABNORMAL LOW (ref 0.61–1.24)
GFR, Estimated: >60 mL/min (ref >60)
Glucose, Bld: 119 mg/dL — ABNORMAL HIGH (ref 70–99)
Potassium: 4.5 mmol/L (ref 3.5–5.1)
Sodium: 136 mmol/L (ref 135–145)

## 2021-12-29 LAB — CBC
HCT: 44.4 % (ref 39.0–52.0)
Hemoglobin: 14.8 g/dL (ref 13.0–17.0)
MCH: 30.2 pg (ref 26.0–34.0)
MCHC: 33.3 g/dL (ref 30.0–36.0)
MCV: 90.6 fL (ref 80.0–100.0)
Platelets: 162 10*3/uL (ref 150–400)
RBC: 4.9 MIL/uL (ref 4.22–5.81)
RDW: 14.2 % (ref 11.5–15.5)
WBC: 7.5 10*3/uL (ref 4.0–10.5)
nRBC: 0 % (ref 0.0–0.2)

## 2021-12-29 LAB — HEPARIN LEVEL (UNFRACTIONATED): Heparin Unfractionated: 0.45 IU/mL (ref 0.30–0.70)

## 2021-12-29 LAB — PHOSPHORUS: Phosphorus: 3.1 mg/dL (ref 2.5–4.6)

## 2021-12-29 LAB — MAGNESIUM: Magnesium: 2.3 mg/dL (ref 1.7–2.4)

## 2021-12-29 NOTE — Progress Notes (Signed)
Progress Note  Patient Name: Stephen Sherman Date of Encounter: 12/29/2021  [facility] Cardiologist: Nelva Bush, MD    Subjective   Patient is still in afib with HR 100-120. NO chest pain or SOB. TEE/DCCV discussed.   Inpatient Medications    Scheduled Meds:  aspirin  324 mg Oral Once   aspirin EC  81 mg Oral Daily   flecainide  75 mg Oral U72Z   folic acid  1 mg Oral Daily   LORazepam  0-4 mg Intravenous Q12H   metoprolol tartrate  12.5 mg Oral BID   multivitamin with minerals  1 tablet Oral Daily   rosuvastatin  20 mg Oral Daily   tamsulosin  0.4 mg Oral Daily   thiamine  100 mg Oral Daily   Or   thiamine  100 mg Intravenous Daily   Continuous Infusions:  diltiazem (CARDIZEM) infusion 10 mg/hr (12/29/21 1100)   heparin 2,100 Units/hr (12/29/21 1100)   PRN Meds: acetaminophen, metoprolol tartrate, ondansetron (ZOFRAN) IV   Vital Signs    Vitals:   12/28/21 2315 12/29/21 0441 12/29/21 0442 12/29/21 0744  BP: 104/69 112/85  112/75  Pulse: (!) 108 (!) 115 (!) 109 (!) 110  Resp: 18 18  20   Temp: 98.4 F (36.9 C) 98 F (36.7 C)  98.3 F (36.8 C)  TempSrc: Oral Oral    SpO2: 96% 95%  (!) 89%  Weight:      Height:        Intake/Output Summary (Last 24 hours) at 12/29/2021 1237 Last data filed at 12/29/2021 1100 Gross per 24 hour  Intake 1278.6 ml  Output --  Net 1278.6 ml   Last 3 Weights 12/27/2021 01/24/2018 08/18/2017  Weight (lbs) 283 lb 4.7 oz 276 lb 276 lb  Weight (kg) 128.5 kg 125.193 kg 125.193 kg      Telemetry    Afib HR 100-120 - Personally Reviewed  ECG    No new - Personally Reviewed  Physical Exam   GEN: No acute distress.   Neck: No JVD Cardiac: Irreg Irreg, tachycarida, no murmurs, rubs, or gallops.  Respiratory: Clear to auscultation bilaterally. GI: Soft, nontender, non-distended  MS: No edema; No deformity. Neuro:  Nonfocal  Psych: Normal affect   Labs    High Sensitivity Troponin:   Recent Labs  Lab 12/27/21 0243  12/27/21 0507 12/27/21 0740 12/27/21 0930  TROPONINIHS 24* 115* 103* 74*     Chemistry Recent Labs  Lab 12/27/21 0243 12/29/21 1139  NA 142 136  K 3.7 4.5  CL 103 103  CO2 28 25  GLUCOSE 146* 119*  BUN 19 16  CREATININE 0.77 0.59*  CALCIUM 9.2 8.8*  MG 1.8 2.3  PROT 7.4  --   ALBUMIN 3.9  --   AST 47*  --   ALT 80*  --   ALKPHOS 54  --   BILITOT 0.5  --   GFRNONAA >60 >60  ANIONGAP 11 8    Lipids  Recent Labs  Lab 12/28/21 0236  CHOL 204*  TRIG 185*  HDL 39*  LDLCALC 128*  CHOLHDL 5.2    Hematology Recent Labs  Lab 12/27/21 0243 12/29/21 0158  WBC 7.9 7.5  RBC 5.08 4.90  HGB 15.1 14.8  HCT 45.7 44.4  MCV 90.0 90.6  MCH 29.7 30.2  MCHC 33.0 33.3  RDW 14.2 14.2  PLT 179 162   Thyroid No results for input(s): TSH, FREET4 in the last 168 hours.  BNPNo results for input(s):  BNP, PROBNP in the last 168 hours.  DDimer No results for input(s): DDIMER in the last 168 hours.   Radiology    ECHOCARDIOGRAM COMPLETE  Result Date: 12/28/2021    ECHOCARDIOGRAM REPORT   Patient Name:   Stephen Sherman Date of Exam: 12/28/2021 Medical Rec #:  [id number]   Height:       71.0 in Accession #:    [id number]  Weight:       283.3 lb Date of Birth:  August 22, 1956   BSA:          2.445 m Patient Age:    66 years    BP:           108/75 mmHg Patient Gender: M           HR:           113 bpm. Exam Location:  [facility] Procedure: 2D Echo and Intracardiac Opacification Agent Indications:     Atrial Flutter  History:         Patient has no prior history of Echocardiogram examinations.                  CAD; Risk Factors:Dyslipidemia and Obesity.  Sonographer:     L Thornton-Maynard Referring Phys:  [id number] [facility] [place] Diagnosing Phys: Skeet Latch MD  Sonographer Comments: Image acquisition challenging due to patient body habitus. IMPRESSIONS  1. Left ventricular ejection fraction, by estimation, is 45 to 50%. The left ventricle has mildly decreased function. The left ventricle demonstrates  regional wall motion abnormalities (see scoring diagram/findings for description). Left ventricular diastolic parameters are indeterminate.  2. Right ventricular systolic function is normal. The right ventricular size is normal.  3. The mitral valve is normal in structure. Trivial mitral valve regurgitation. No evidence of mitral stenosis.  4. The aortic valve is normal in structure. Aortic valve regurgitation is not visualized. No aortic stenosis is present. FINDINGS  Left Ventricle: Image quality is poor. Large epicarcdial fat pad. Mild global hypokinesis worse in the anterolateral, apical anterior and apical myocardium. Left ventricular ejection fraction, by estimation, is 45 to 50%. The left ventricle has mildly decreased function. The left ventricle demonstrates regional wall motion abnormalities. Definity contrast agent was given IV to delineate the left ventricular endocardial borders. The left ventricular internal cavity size was normal in size. There is no left ventricular hypertrophy. Left ventricular diastolic parameters are indeterminate. Right Ventricle: The right ventricular size is normal. No increase in right ventricular wall thickness. Right ventricular systolic function is normal. Left Atrium: Left atrial size was normal in size. Right Atrium: Right atrial size was normal in size. Pericardium: There is no evidence of pericardial effusion. Mitral Valve: The mitral valve is normal in structure. Trivial mitral valve regurgitation. No evidence of mitral valve stenosis. Tricuspid Valve: The tricuspid valve is normal in structure. Tricuspid valve regurgitation is not demonstrated. No evidence of tricuspid stenosis. Aortic Valve: The aortic valve is normal in structure. Aortic valve regurgitation is not visualized. No aortic stenosis is present. Aortic valve mean gradient measures 2.5 mmHg. Aortic valve peak gradient measures 4.0 mmHg. Aortic valve area, by VTI measures 2.39 cm. Pulmonic Valve: The  pulmonic valve was normal in structure. Pulmonic valve regurgitation is not visualized. No evidence of pulmonic stenosis. Aorta: The aortic root is normal in size and structure. Venous: IVC assessment for right atrial pressure unable to be performed due to mechanical ventilation. IAS/Shunts: No atrial level shunt detected by color flow Doppler.  LEFT VENTRICLE PLAX  2D LVOT diam:     2.20 cm     Diastology LV SV:         40          LV e' medial:    12.40 cm/s LV SV Index:   16          LV E/e' medial:  5.8 LVOT Area:     3.80 cm    LV e' lateral:   17.20 cm/s                            LV E/e' lateral: 4.2  LV Volumes (MOD) LV vol d, MOD A2C: 66.5 ml LV vol d, MOD A4C: 99.8 ml LV vol s, MOD A2C: 30.7 ml LV vol s, MOD A4C: 40.2 ml LV SV MOD A2C:     35.8 ml LV SV MOD A4C:     99.8 ml LV SV MOD BP:      45.1 ml LEFT ATRIUM             Index        RIGHT ATRIUM           Index LA Vol (A2C):   34.9 ml 14.28 ml/m  RA Area:     20.20 cm LA Vol (A4C):   54.9 ml 22.46 ml/m  RA Volume:   56.70 ml  23.19 ml/m LA Biplane Vol: 43.8 ml 17.92 ml/m  AORTIC VALVE                    PULMONIC VALVE AV Area (Vmax):    2.70 cm     PV Vmax:       0.69 m/s AV Area (Vmean):   2.36 cm     PV Peak grad:  1.9 mmHg AV Area (VTI):     2.39 cm AV Vmax:           100.00 cm/s AV Vmean:          77.950 cm/s AV VTI:            0.168 m AV Peak Grad:      4.0 mmHg AV Mean Grad:      2.5 mmHg LVOT Vmax:         71.10 cm/s LVOT Vmean:        48.300 cm/s LVOT VTI:          0.106 m LVOT/AV VTI ratio: 0.63 MITRAL VALVE MV Area (PHT): 4.93 cm    SHUNTS MV Decel Time: 154 msec    Systemic VTI:  0.11 m MV E velocity: 71.60 cm/s  Systemic Diam: 2.20 cm Skeet Latch MD Electronically signed by Skeet Latch MD Signature Date/Time: 12/28/2021/2:36:05 PM    Final     Cardiac Studies   Echo 12/28/21  1. Left ventricular ejection fraction, by estimation, is 45 to 50%. The  left ventricle has mildly decreased function. The left ventricle   demonstrates regional wall motion abnormalities (see scoring  diagram/findings for description). Left ventricular  diastolic parameters are indeterminate.   2. Right ventricular systolic function is normal. The right ventricular  size is normal.   3. The mitral valve is normal in structure. Trivial mitral valve  regurgitation. No evidence of mitral stenosis.   4. The aortic valve is normal in structure. Aortic valve regurgitation is  not visualized. No aortic stenosis is present.   Patient Profile     66 y.o. male  with a hx of non-obstructive CAD, hyperlipidemia, OSA, paroxysmal atrial flutter, prostate cancer  who is being seen 12/27/2021 for the evaluation of atrial flutter  Assessment & Plan    Aflutter - prior TEE/DCCV 11/2019.  - PTA flecainide.  - IV dilt - Still in aflutter with suboptimally controlled heart rates - flecainide increased and metoprolol added - Echo showed mildly reduced EF 45-50%, WMA (see report above) - CHADSVASC at least 1 - IV heparin - plan for TEE/DCCV tomorrow  Cardiomyopathy - Echo showed mildly reduced EF in the setting of rapid afib - No anginal symptoms reported - GDMT as able - After establishment of SR can re-check an echo in 2-3 months, if EF does not improve, can consider cath at that time.   Elevated troponin CAD - no chest pain reported - suspect troponin elevation from demand ischemia - continue medical management - echo with mildly reduced EF, plan as above  HLD - LDL 128 - Crestor increased to 20mg .   Suspected OSA - needs OP testing  For questions or updates, please contact [place] Please consult [email or web address] for contact info under        Signed, Tyriana Helmkamp Ninfa Meeker, PA-C  12/29/2021, 12:37 PM

## 2021-12-29 NOTE — Progress Notes (Signed)
[place] [facility] [place] NAME: Stephen Sherman    MR#:  [id number]  DATE OF BIRTH:  [birth year]  SUBJECTIVE:  patient came in with elevated heart rate indicated by his Apple Watch. Denies any chest pain. Has history of atrial flutter/fibrillation. Heart rate ranging from 99 to 110. Currently on IV diltiazem drip and IV heparin drip. Oral rate blocking agents have been added by cardiology.  Patient denies any chest pain.    VITALS:  Blood pressure 112/75, pulse (!) 110, temperature 98.3 F (36.8 C), resp. rate 20, height 5\' 11"  (1.803 m), weight 128.5 kg, SpO2 (!) 89 %.  PHYSICAL EXAMINATION:   GENERAL:  66 y.o.-year-old patient lying in the bed with no acute distress. Obesity LUNGS: Normal breath sounds bilaterally, no wheezing, rales, rhonchi.  CARDIOVASCULAR: S1, S2 normal. No murmurs, rubs, or gallops. Tachycardia+ ABDOMEN: Soft, nontender, nondistended. Bowel sounds present.  EXTREMITIES: No  edema b/l.    NEUROLOGIC: nonfocal  patient is alert and awake   LABORATORY PANEL:  CBC Recent Labs  Lab 12/29/21 0158  WBC 7.5  HGB 14.8  HCT 44.4  PLT 162     Chemistries  Recent Labs  Lab 12/27/21 0243  NA 142  K 3.7  CL 103  CO2 28  GLUCOSE 146*  BUN 19  CREATININE 0.77  CALCIUM 9.2  MG 1.8  AST 47*  ALT 80*  ALKPHOS 54  BILITOT 0.5    Cardiac Enzymes No results for input(s): TROPONINI in the last 168 hours. RADIOLOGY:  ECHOCARDIOGRAM COMPLETE  Result Date: 12/28/2021    ECHOCARDIOGRAM REPORT   Patient Name:   Stephen Sherman Date of Exam: 12/28/2021 Medical Rec #:  [id number]   Height:       71.0 in Accession #:    [id number]  Weight:       283.3 lb Date of Birth:  1956-09-05   BSA:          2.445 m Patient Age:    87 years    BP:           108/75 mmHg Patient Gender: M           HR:           113 bpm. Exam Location:  [facility] Procedure: 2D Echo and Intracardiac Opacification Agent Indications:     Atrial Flutter  History:          Patient has no prior history of Echocardiogram examinations.                  CAD; Risk Factors:Dyslipidemia and Obesity.  Sonographer:     L Thornton-Maynard Referring Phys:  [id number] [facility] [place] Diagnosing Phys: Skeet Latch MD  Sonographer Comments: Image acquisition challenging due to patient body habitus. IMPRESSIONS  1. Left ventricular ejection fraction, by estimation, is 45 to 50%. The left ventricle has mildly decreased function. The left ventricle demonstrates regional wall motion abnormalities (see scoring diagram/findings for description). Left ventricular diastolic parameters are indeterminate.  2. Right ventricular systolic function is normal. The right ventricular size is normal.  3. The mitral valve is normal in structure. Trivial mitral valve regurgitation. No evidence of mitral stenosis.  4. The aortic valve is normal in structure. Aortic valve regurgitation is not visualized. No aortic stenosis is present. FINDINGS  Left Ventricle: Image quality is poor. Large epicarcdial fat pad. Mild global hypokinesis worse in the anterolateral, apical anterior and apical myocardium. Left ventricular ejection fraction, by  estimation, is 45 to 50%. The left ventricle has mildly decreased function. The left ventricle demonstrates regional wall motion abnormalities. Definity contrast agent was given IV to delineate the left ventricular endocardial borders. The left ventricular internal cavity size was normal in size. There is no left ventricular hypertrophy. Left ventricular diastolic parameters are indeterminate. Right Ventricle: The right ventricular size is normal. No increase in right ventricular wall thickness. Right ventricular systolic function is normal. Left Atrium: Left atrial size was normal in size. Right Atrium: Right atrial size was normal in size. Pericardium: There is no evidence of pericardial effusion. Mitral Valve: The mitral valve is normal in structure. Trivial mitral valve  regurgitation. No evidence of mitral valve stenosis. Tricuspid Valve: The tricuspid valve is normal in structure. Tricuspid valve regurgitation is not demonstrated. No evidence of tricuspid stenosis. Aortic Valve: The aortic valve is normal in structure. Aortic valve regurgitation is not visualized. No aortic stenosis is present. Aortic valve mean gradient measures 2.5 mmHg. Aortic valve peak gradient measures 4.0 mmHg. Aortic valve area, by VTI measures 2.39 cm. Pulmonic Valve: The pulmonic valve was normal in structure. Pulmonic valve regurgitation is not visualized. No evidence of pulmonic stenosis. Aorta: The aortic root is normal in size and structure. Venous: IVC assessment for right atrial pressure unable to be performed due to mechanical ventilation. IAS/Shunts: No atrial level shunt detected by color flow Doppler.  LEFT VENTRICLE PLAX 2D LVOT diam:     2.20 cm     Diastology LV SV:         40          LV e' medial:    12.40 cm/s LV SV Index:   16          LV E/e' medial:  5.8 LVOT Area:     3.80 cm    LV e' lateral:   17.20 cm/s                            LV E/e' lateral: 4.2  LV Volumes (MOD) LV vol d, MOD A2C: 66.5 ml LV vol d, MOD A4C: 99.8 ml LV vol s, MOD A2C: 30.7 ml LV vol s, MOD A4C: 40.2 ml LV SV MOD A2C:     35.8 ml LV SV MOD A4C:     99.8 ml LV SV MOD BP:      45.1 ml LEFT ATRIUM             Index        RIGHT ATRIUM           Index LA Vol (A2C):   34.9 ml 14.28 ml/m  RA Area:     20.20 cm LA Vol (A4C):   54.9 ml 22.46 ml/m  RA Volume:   56.70 ml  23.19 ml/m LA Biplane Vol: 43.8 ml 17.92 ml/m  AORTIC VALVE                    PULMONIC VALVE AV Area (Vmax):    2.70 cm     PV Vmax:       0.69 m/s AV Area (Vmean):   2.36 cm     PV Peak grad:  1.9 mmHg AV Area (VTI):     2.39 cm AV Vmax:           100.00 cm/s AV Vmean:          77.950 cm/s AV VTI:  0.168 m AV Peak Grad:      4.0 mmHg AV Mean Grad:      2.5 mmHg LVOT Vmax:         71.10 cm/s LVOT Vmean:        48.300 cm/s LVOT VTI:           0.106 m LVOT/AV VTI ratio: 0.63 MITRAL VALVE MV Area (PHT): 4.93 cm    SHUNTS MV Decel Time: 154 msec    Systemic VTI:  0.11 m MV E velocity: 71.60 cm/s  Systemic Diam: 2.20 cm Skeet Latch MD Electronically signed by Skeet Latch MD Signature Date/Time: 12/28/2021/2:36:05 PM    Final     Assessment and Plan Stephen Sherman is a 66 y.o. male with medical history significant of A flutter, HLD, CAD, prostate cancer,  alcohol use, who presents with palpitation.   Patient states that his symptoms started in the middle night, described as palpitation, chest discomfort with burning sensation, lightheadedness.  Denies active chest pain, cough, shortness of breath. He checked his pulse oximeter which also has a heart rate monitor and his heart rate was listed as 245  Atrial flutter with rapid ventricular response ([place]): HR was up to 245, improved to 110-120 after treated with metoprolol.  No active chest pain.   --CHA2DS2-VASc Score is 1 --Dr. Oval Linsey of cardiology input appreciated. Patient is on Cardizem drip, IV heparin drip, PO metoprolol and PO flecainide. Does being adjusted by cardiology. Patient may need cardioversion.  -- Electrolytes being repleted --2/13--Pt was npo for CV--Dr Fletcher Anon will do it tomorrow due to Anesthesia not arranged by weekend cardiology ?LHC--defer to cardiology --ECho ef 45-50% with Large epicarcdial fat pad. Mild  global hypokinesis worse in the anterolateral, apical anterior and apical myocardium. Left ventricular ejection fraction, by estimation, is 45 to 50%. The left ventricle has mildly decreased function. The left ventricle demonstrates regional wall motion abnormalities.   Echo 2021 at [facility] Normal LV function and no WMA  Elevated troponin and and CAD (coronary artery disease): trop 21 --> 115. No chest pain.  -IV heparin started in ED --Continue aspirin -Patient states that he is not taking Crestor since it will cause joint pain   HLD (hyperlipidemia):  Patient not taking Crestor currently   Prostate cancer and BPH -Flomax     DVT ppx: on IV Heparin      Family communication : Consults :[facility] cardiology CODE STATUS: Full code DVT Prophylaxis :heparin Level of care: Progressive Status is: Inpatient Remains inpatient appropriate because: Atrial flutter/fib. Needs CV and possible LHC            TOTAL TIME TAKING CARE OF THIS PATIENT: 30 minutes.  >50% time spent on counselling and coordination of care  Note: This dictation was prepared with Dragon dictation along with smaller phrase technology. Any transcriptional errors that result from this process are unintentional.  Fritzi Mandes M.D    [facility]   CC: Primary care physician; [name], MD

## 2021-12-29 NOTE — Anesthesia Preprocedure Evaluation (Addendum)
Anesthesia Evaluation  Patient identified by MRN, date of birth, ID band Patient awake    Reviewed: Allergy & Precautions, NPO status , Patient's Chart, lab work & pertinent test results  History of Anesthesia Complications Negative for: history of anesthetic complications  Airway Mallampati: III   Neck ROM: Full    Dental   Missing upper left teeth:   Pulmonary former smoker (quit 2005),    Pulmonary exam normal breath sounds clear to auscultation       Cardiovascular + dysrhythmias (a flutter)  Rhythm:Irregular Rate:Normal  ECG 12/27/21:  Atrial flutter with predominant 2:1 AV block  Echo 12/28/21:  1. Left ventricular ejection fraction, by estimation, is 45 to 50%. The left ventricle has mildly decreased function. The left ventricle demonstrates regional wall motion abnormalities (see scoring diagram/findings for description). Left ventricular diastolic parameters are indeterminate.  2. Right ventricular systolic function is normal. The right ventricular size is normal.  3. The mitral valve is normal in structure. Trivial mitral valve regurgitation. No evidence of mitral stenosis.  4. The aortic valve is normal in structure. Aortic valve regurgitation is not visualized. No aortic stenosis is present.   Neuro/Psych negative neurological ROS     GI/Hepatic negative GI ROS,   Endo/Other  Obesity   Renal/GU negative Renal ROS   Prostate CA    Musculoskeletal   Abdominal   Peds  Hematology negative hematology ROS (+)   Anesthesia Other Findings Reviewed 12/29/21 cardiology note.  Reproductive/Obstetrics                            Anesthesia Physical Anesthesia Plan  ASA: 3  Anesthesia Plan: General   Post-op Pain Management:    Induction: Intravenous  PONV Risk Score and Plan: 2 and Propofol infusion, TIVA and Treatment may vary due to age or medical condition  Airway Management  Planned: Natural Airway  Additional Equipment:   Intra-op Plan:   Post-operative Plan:   Informed Consent: I have reviewed the patients History and Physical, chart, labs and discussed the procedure including the risks, benefits and alternatives for the proposed anesthesia with the patient or authorized representative who has indicated his/her understanding and acceptance.       Plan Discussed with: CRNA  Anesthesia Plan Comments: (LMA/GETA backup discussed.  Patient consented for risks of anesthesia including but not limited to:  - adverse reactions to medications - damage to eyes, teeth, lips or other oral mucosa - nerve damage due to positioning  - sore throat or hoarseness - damage to heart, brain, nerves, lungs, other parts of body or loss of life  Informed patient about role of CRNA in peri- and intra-operative care.  Patient voiced understanding.)        Anesthesia Quick Evaluation

## 2021-12-29 NOTE — Progress Notes (Signed)
ANTICOAGULATION CONSULT NOTE  Pharmacy Consult for heparin infusion Indication: A-Fib (A-flutter w/ RVR and elevated troponin)  Allergies  Allergen Reactions   Morphine Nausea Only    Patient Measurements: Height: 5\' 11"  (180.3 cm) Weight: 128.5 kg (283 lb 4.7 oz) IBW/kg (Calculated) : 75.3 Heparin Dosing Weight: 104.4 kg  Vital Signs: Temp: 98.4 F (36.9 C) (02/12 2315) Temp Source: Oral (02/12 2315) BP: 104/69 (02/12 2315) Pulse Rate: 108 (02/12 2315)  Labs: Recent Labs    12/27/21 0243 12/27/21 0507 12/27/21 0740 12/27/21 0930 12/27/21 1428 12/28/21 1154 12/28/21 1913 12/29/21 0158  HGB 15.1  --   --   --   --   --   --  14.8  HCT 45.7  --   --   --   --   --   --  44.4  PLT 179  --   --   --   --   --   --  162  APTT 30  --   --   --   --   --   --   --   LABPROT 12.6  --   --   --   --   --   --   --   INR 0.9  --   --   --   --   --   --   --   HEPARINUNFRC  --   --   --   --    < > 0.28* 0.45 0.45  CREATININE 0.77  --   --   --   --   --   --   --   TROPONINIHS 24* 115* 103* 74*  --   --   --   --    < > = values in this interval not displayed.     Estimated Creatinine Clearance: 127.5 mL/min (by C-G formula based on SCr of 0.77 mg/dL).   Medical History: Past Medical History:  Diagnosis Date   Atrial flutter (HCC)    CAD (coronary artery disease)    Cancer (HCC)    Prostate   HLD (hyperlipidemia)     Assessment: Pt is 66 yo male with h/o A-fib presenting to ED c/o being woke up by sudden onset of chest discomfort, found with A-flutter and slightly elevated Troponin I.  Date Time HL Rate/comment 2/11 1428 0.17 1500 un/hr, subthera 2/11 2025 0.38 1800 un/hr, therapeutic 2/12 0236 0.31 Slightly therapeutic,downward trend 2/12 1154 0.28 Subtherapeutic 2/12 1913 0.45 2100 un/hr, therapeutic 2/13 0158 0.45  Goal of Therapy:  Heparin level 0.3-0.7 units/ml Monitor platelets by anticoagulation protocol: Yes   Plan:  Heparin level 0.45,  therapeutic  Continue heparin infusion at 2100 units/hr Recheck HL daily with AM labs while therapeutic on heparin CBC daily while on heparin  Renda Rolls, PharmD, [facility] 12/29/2021 2:29 AM

## 2021-12-30 ENCOUNTER — Encounter
Admission: EM | Disposition: A | Discharge status: Home / Self Care | Payer: Self-pay | Source: Home / Self Care | Attending: Internal Medicine

## 2021-12-30 ENCOUNTER — Inpatient Hospital Stay: Payer: No Typology Code available for payment source | Admitting: Anesthesiology

## 2021-12-30 ENCOUNTER — Inpatient Hospital Stay (HOSPITAL_COMMUNITY)
Admit: 2021-12-30 | Discharge: 2021-12-30 | Disposition: A | Discharge status: Home / Self Care | Payer: No Typology Code available for payment source | Attending: Medical | Admitting: Medical

## 2021-12-30 DIAGNOSIS — I340 Nonrheumatic mitral (valve) insufficiency: Secondary | ICD-10-CM

## 2021-12-30 DIAGNOSIS — I248 Other forms of acute ischemic heart disease: Secondary | ICD-10-CM

## 2021-12-30 DIAGNOSIS — I429 Cardiomyopathy, unspecified: Secondary | ICD-10-CM

## 2021-12-30 DIAGNOSIS — I4892 Unspecified atrial flutter: Secondary | ICD-10-CM

## 2021-12-30 DIAGNOSIS — I361 Nonrheumatic tricuspid (valve) insufficiency: Secondary | ICD-10-CM

## 2021-12-30 HISTORY — PX: CARDIOVERSION: SHX1299

## 2021-12-30 HISTORY — PX: TEE WITHOUT CARDIOVERSION: SHX5443

## 2021-12-30 LAB — CBC
HCT: 44.1 % (ref 39.0–52.0)
Hemoglobin: 14.6 g/dL (ref 13.0–17.0)
MCH: 30.2 pg (ref 26.0–34.0)
MCHC: 33.1 g/dL (ref 30.0–36.0)
MCV: 91.1 fL (ref 80.0–100.0)
Platelets: 160 10*3/uL (ref 150–400)
RBC: 4.84 MIL/uL (ref 4.22–5.81)
RDW: 14.4 % (ref 11.5–15.5)
WBC: 6.5 10*3/uL (ref 4.0–10.5)
nRBC: 0 % (ref 0.0–0.2)

## 2021-12-30 LAB — HEPARIN LEVEL (UNFRACTIONATED): Heparin Unfractionated: 0.41 IU/mL (ref 0.30–0.70)

## 2021-12-30 SURGERY — CARDIOVERSION
Anesthesia: General

## 2021-12-30 MED ORDER — ASPIRIN 81 MG PO TBEC: 81.0000 mg | DELAYED_RELEASE_TABLET | Freq: Every day | ORAL | 11 refills | Status: AC

## 2021-12-30 MED ORDER — SODIUM CHLORIDE FLUSH 0.9 % IV SOLN
INTRAVENOUS | Status: AC
  Filled 2021-12-30: qty 10

## 2021-12-30 MED ORDER — APIXABAN 5 MG PO TABS: 5.0000 mg | ORAL_TABLET | Freq: Two times a day (BID) | ORAL | 2 refills | Status: DC

## 2021-12-30 MED ORDER — SODIUM CHLORIDE 0.9 % IV SOLN: INTRAVENOUS | Status: DC

## 2021-12-30 MED ORDER — METOPROLOL SUCCINATE ER 25 MG PO TB24: 25.0000 mg | ORAL_TABLET | Freq: Every day | ORAL | 2 refills | Status: DC

## 2021-12-30 MED ORDER — FLECAINIDE ACETATE 150 MG PO TABS: 75.0000 mg | ORAL_TABLET | Freq: Two times a day (BID) | ORAL | 2 refills | Status: DC

## 2021-12-30 MED ORDER — APIXABAN 5 MG PO TABS
5.0000 mg | ORAL_TABLET | Freq: Two times a day (BID) | ORAL | Status: DC
  Administered 2021-12-30: 5 mg via ORAL
  Filled 2021-12-30: qty 1

## 2021-12-30 MED ORDER — METOPROLOL SUCCINATE ER 25 MG PO TB24
25.0000 mg | ORAL_TABLET | Freq: Every day | ORAL | Status: DC
  Administered 2021-12-30: 25 mg via ORAL
  Filled 2021-12-30: qty 1

## 2021-12-30 MED ORDER — PROPOFOL 10 MG/ML IV BOLUS
INTRAVENOUS | Status: DC | PRN
Start: 2021-12-30 — End: 2021-12-30
  Administered 2021-12-30: 30 mg via INTRAVENOUS
  Administered 2021-12-30 (×3): 20 mg via INTRAVENOUS
  Administered 2021-12-30: 40 mg via INTRAVENOUS

## 2021-12-30 MED ORDER — IBUPROFEN 200 MG PO TABS: 200.0000 mg | ORAL_TABLET | Freq: Two times a day (BID) | ORAL | 0 refills | Status: DC | PRN

## 2021-12-30 MED ORDER — PHENYLEPHRINE HCL (PRESSORS) 10 MG/ML IV SOLN
INTRAVENOUS | Status: DC | PRN
  Administered 2021-12-30: 80 ug via INTRAVENOUS

## 2021-12-30 MED ORDER — PROPOFOL 10 MG/ML IV BOLUS
INTRAVENOUS | Status: AC
  Filled 2021-12-30: qty 20

## 2021-12-30 MED ORDER — MIDAZOLAM HCL 2 MG/2ML IJ SOLN
INTRAMUSCULAR | Status: DC | PRN
  Administered 2021-12-30: 2 mg via INTRAVENOUS

## 2021-12-30 MED ORDER — BUTAMBEN-TETRACAINE-BENZOCAINE 2-2-14 % EX AERO
INHALATION_SPRAY | CUTANEOUS | Status: AC
  Filled 2021-12-30: qty 5

## 2021-12-30 MED ORDER — SODIUM CHLORIDE 0.9 % IV SOLN: INTRAVENOUS | Status: DC | PRN

## 2021-12-30 MED ORDER — LIDOCAINE VISCOUS HCL 2 % MT SOLN
OROMUCOSAL | Status: AC
  Filled 2021-12-30: qty 15

## 2021-12-30 MED ORDER — MIDAZOLAM HCL 2 MG/2ML IJ SOLN
INTRAMUSCULAR | Status: AC
  Filled 2021-12-30: qty 2

## 2021-12-30 NOTE — Anesthesia Procedure Notes (Signed)
Date/Time: 12/30/2021 7:45 AM Performed by: Johnna Acosta, CRNA Pre-anesthesia Checklist: Patient identified, Emergency Drugs available, Suction available, Patient being monitored and Timeout performed Patient Re-evaluated:Patient Re-evaluated prior to induction Oxygen Delivery Method: Nasal cannula Preoxygenation: Pre-oxygenation with 100% oxygen Induction Type: IV induction

## 2021-12-30 NOTE — Anesthesia Postprocedure Evaluation (Signed)
Anesthesia Post Note  Patient: Stephen Sherman  Procedure(s) Performed: CARDIOVERSION TRANSESOPHAGEAL ECHOCARDIOGRAM (TEE)  Patient location during evaluation: PACU Anesthesia Type: General Level of consciousness: awake and alert, oriented and patient cooperative Pain management: pain level controlled Vital Signs Assessment: post-procedure vital signs reviewed and stable Respiratory status: spontaneous breathing, nonlabored ventilation and respiratory function stable Cardiovascular status: blood pressure returned to baseline and stable Postop Assessment: adequate PO intake Anesthetic complications: no   No notable events documented.   Last Vitals:  Vitals:   12/30/21 0815 12/30/21 0820  BP: 92/80 91/64  Pulse: 68 66  Resp: 13 16  Temp:    SpO2: 94% 95%    Last Pain:  Vitals:   12/30/21 0820  TempSrc:   PainSc: 0-No pain                 Darrin Nipper

## 2021-12-30 NOTE — Discharge Summary (Signed)
Physician Discharge Summary   Patient: Stephen Sherman MRN: [id number] DOB: [birth year]  Admit date:     12/27/2021  Discharge date: 12/30/21  Discharge Physician: Fritzi Mandes   PCP: [name], MD   Recommendations at discharge:    F/u [facility] cardiology Dr End in 1 week  Discharge Diagnoses: Atrial flutter s/p TEE cardioversion  Hospital Course:  Stephen Sherman is a 66 y.o. male with medical history significant of A flutter, HLD, CAD, prostate cancer,  alcohol use, who presents with palpitation.   Patient states that his symptoms started in the middle night, described as palpitation, chest discomfort with burning sensation, lightheadedness.  Denies active chest pain, cough, shortness of breath. He checked his pulse oximeter which also has a heart rate monitor and his heart rate was listed as 245   Atrial flutter with rapid ventricular response ([place]): HR was up to 245, improved to 110-120 after treated with metoprolol.  No active chest pain.   --CHA2DS2-VASc Score is 1 --Dr. Oval Linsey of cardiology input appreciated. Patient is on Cardizem drip, IV heparin drip, PO metoprolol and PO flecainide. Does being adjusted by cardiology.  -- Electrolytes being repleted --2/13--Pt was npo for CV--Dr Fletcher Anon will do it tomorrow due to Anesthesia not arranged by weekend cardiology --ECho ef 45-50% with Large epicarcdial fat pad. Mild  global hypokinesis worse in the anterolateral, apical anterior and apical myocardium. Left ventricular ejection fraction, by estimation, is 45 to 50%. The left ventricle has mildly decreased function. The left ventricle demonstrates regional wall motion abnormalities.  --2/14--S/p CV. Now in SR. EF per TEE 55%. Ok to d/c per cardiology Dr End on po toprol XL, Flecainide, low dose asa and Eliquis (likely will be d/ced as out pt in 1-2 months)   Echo 2021 at [facility] Normal LV function and no WMA   Elevated troponin  suspected demand ischemia with rapid  A flutter H/o CAD (coronary  artery disease): trop 21 --> 115. No chest pain.  -IV heparin started in ED--now d/ced --Continue aspirin   HLD (hyperlipidemia): Patient not taking Crestor currently   Prostate cancer and BPH -Flomax          Consultants: [facility] cardiology Procedures performed: TEE cardioversion  Disposition: Home Diet recommendation:  Discharge Diet Orders (From admission, onward)     Start     Ordered   12/30/21 0000  Diet - low sodium heart healthy        12/30/21 1036           Cardiac diet  DISCHARGE MEDICATION: Allergies as of 12/30/2021       Reactions   Morphine Nausea Only        Medication List     STOP taking these medications    bicalutamide 50 MG tablet Commonly known as: CASODEX   gabapentin 400 MG capsule Commonly known as: Neurontin   HYDROcodone-acetaminophen 7.5-325 MG tablet Commonly known as: Norco   leuprolide 22.5 MG injection Commonly known as: LUPRON   levofloxacin 500 MG tablet Commonly known as: LEVAQUIN   meloxicam 15 MG tablet Commonly known as: MOBIC   vitamin C 500 MG tablet Commonly known as: ASCORBIC ACID       TAKE these medications    apixaban 5 MG Tabs tablet Commonly known as: ELIQUIS Take 1 tablet (5 mg total) by mouth 2 (two) times daily.   aspirin 81 MG EC tablet Take 1 tablet (81 mg total) by mouth daily. Swallow whole. Start taking on: December 31, 2021  What changed:  medication strength how much to take when to take this additional instructions   flecainide 150 MG tablet Commonly known as: TAMBOCOR Take 0.5 tablets (75 mg total) by mouth every 12 (twelve) hours. What changed:  medication strength how much to take when to take this   ibuprofen 200 MG tablet Commonly known as: ADVIL Take 1 tablet (200 mg total) by mouth 2 (two) times daily as needed. What changed: when to take this   metoprolol succinate 25 MG 24 hr tablet Commonly known as: TOPROL-XL Take 1 tablet (25 mg total) by mouth daily.    rosuvastatin 5 MG tablet Commonly known as: CRESTOR Take 1 tablet by mouth daily.   tamsulosin 0.4 MG Caps capsule Commonly known as: FLOMAX Take 0.4 mg by mouth daily.   vitamin E 180 MG (400 UNITS) capsule Take 400 Units by mouth daily.        Follow-up Information     End, Harrell Gave, MD. Schedule an appointment as soon as possible for a visit in 1 week(s).   Specialty: Cardiology Why: Aflutter s/p CV Contact information: [address] [place] [address] [phone]                 Discharge Exam: Danley Danker Weights   12/27/21 5638  Weight: 128.5 kg   GENERAL:  66 y.o.-year-old patient lying in the bed with no acute distress. Obese LUNGS: Normal breath sounds bilaterally, no wheezing, rales, rhonchi.  CARDIOVASCULAR: S1, S2 normal. No murmurs, rubs, or gallops.  ABDOMEN: Soft, nontender, nondistended. Bowel sounds present.  EXTREMITIES: No  edema b/l.      Condition at discharge: stable  The results of significant diagnostics from this hospitalization (including imaging, microbiology, ancillary and laboratory) are listed below for reference.   Imaging Studies: ECHOCARDIOGRAM COMPLETE  Result Date: 12/28/2021    ECHOCARDIOGRAM REPORT   Patient Name:   Stephen Sherman Date of Exam: 12/28/2021 Medical Rec #:  [id number]   Height:       71.0 in Accession #:    [id number]  Weight:       283.3 lb Date of Birth:  06-20-1956   BSA:          2.445 m Patient Age:    30 years    BP:           108/75 mmHg Patient Gender: M           HR:           113 bpm. Exam Location:  [facility] Procedure: 2D Echo and Intracardiac Opacification Agent Indications:     Atrial Flutter  History:         Patient has no prior history of Echocardiogram examinations.                  CAD; Risk Factors:Dyslipidemia and Obesity.  Sonographer:     L Thornton-Maynard Referring Phys:  [id number] [facility] Diagnosing Phys: Skeet Latch MD  Sonographer Comments: Image acquisition  challenging due to patient body habitus. IMPRESSIONS  1. Left ventricular ejection fraction, by estimation, is 45 to 50%. The left ventricle has mildly decreased function. The left ventricle demonstrates regional wall motion abnormalities (see scoring diagram/findings for description). Left ventricular diastolic parameters are indeterminate.  2. Right ventricular systolic function is normal. The right ventricular size is normal.  3. The mitral valve is normal in structure. Trivial mitral valve regurgitation. No evidence of mitral stenosis.  4. The aortic valve is normal  in structure. Aortic valve regurgitation is not visualized. No aortic stenosis is present. FINDINGS  Left Ventricle: Image quality is poor. Large epicarcdial fat pad. Mild global hypokinesis worse in the anterolateral, apical anterior and apical myocardium. Left ventricular ejection fraction, by estimation, is 45 to 50%. The left ventricle has mildly decreased function. The left ventricle demonstrates regional wall motion abnormalities. Definity contrast agent was given IV to delineate the left ventricular endocardial borders. The left ventricular internal cavity size was normal in size. There is no left ventricular hypertrophy. Left ventricular diastolic parameters are indeterminate. Right Ventricle: The right ventricular size is normal. No increase in right ventricular wall thickness. Right ventricular systolic function is normal. Left Atrium: Left atrial size was normal in size. Right Atrium: Right atrial size was normal in size. Pericardium: There is no evidence of pericardial effusion. Mitral Valve: The mitral valve is normal in structure. Trivial mitral valve regurgitation. No evidence of mitral valve stenosis. Tricuspid Valve: The tricuspid valve is normal in structure. Tricuspid valve regurgitation is not demonstrated. No evidence of tricuspid stenosis. Aortic Valve: The aortic valve is normal in structure. Aortic valve regurgitation is not  visualized. No aortic stenosis is present. Aortic valve mean gradient measures 2.5 mmHg. Aortic valve peak gradient measures 4.0 mmHg. Aortic valve area, by VTI measures 2.39 cm. Pulmonic Valve: The pulmonic valve was normal in structure. Pulmonic valve regurgitation is not visualized. No evidence of pulmonic stenosis. Aorta: The aortic root is normal in size and structure. Venous: IVC assessment for right atrial pressure unable to be performed due to mechanical ventilation. IAS/Shunts: No atrial level shunt detected by color flow Doppler.  LEFT VENTRICLE PLAX 2D LVOT diam:     2.20 cm     Diastology LV SV:         40          LV e' medial:    12.40 cm/s LV SV Index:   16          LV E/e' medial:  5.8 LVOT Area:     3.80 cm    LV e' lateral:   17.20 cm/s                            LV E/e' lateral: 4.2  LV Volumes (MOD) LV vol d, MOD A2C: 66.5 ml LV vol d, MOD A4C: 99.8 ml LV vol s, MOD A2C: 30.7 ml LV vol s, MOD A4C: 40.2 ml LV SV MOD A2C:     35.8 ml LV SV MOD A4C:     99.8 ml LV SV MOD BP:      45.1 ml LEFT ATRIUM             Index        RIGHT ATRIUM           Index LA Vol (A2C):   34.9 ml 14.28 ml/m  RA Area:     20.20 cm LA Vol (A4C):   54.9 ml 22.46 ml/m  RA Volume:   56.70 ml  23.19 ml/m LA Biplane Vol: 43.8 ml 17.92 ml/m  AORTIC VALVE                    PULMONIC VALVE AV Area (Vmax):    2.70 cm     PV Vmax:       0.69 m/s AV Area (Vmean):   2.36 cm     PV Peak grad:  1.9  mmHg AV Area (VTI):     2.39 cm AV Vmax:           100.00 cm/s AV Vmean:          77.950 cm/s AV VTI:            0.168 m AV Peak Grad:      4.0 mmHg AV Mean Grad:      2.5 mmHg LVOT Vmax:         71.10 cm/s LVOT Vmean:        48.300 cm/s LVOT VTI:          0.106 m LVOT/AV VTI ratio: 0.63 MITRAL VALVE MV Area (PHT): 4.93 cm    SHUNTS MV Decel Time: 154 msec    Systemic VTI:  0.11 m MV E velocity: 71.60 cm/s  Systemic Diam: 2.20 cm Skeet Latch MD Electronically signed by Skeet Latch MD Signature Date/Time:  12/28/2021/2:36:05 PM    Final     Microbiology: Results for orders placed or performed during the hospital encounter of 12/27/21  Resp Panel by RT-PCR (Flu A&B, Covid) Nasopharyngeal Swab     Status: None   Collection Time: 12/27/21  5:35 AM   Specimen: Nasopharyngeal Swab; Nasopharyngeal(NP) swabs in vial transport medium  Result Value Ref Range Status   SARS Coronavirus 2 by RT PCR NEGATIVE NEGATIVE Final    Comment: (NOTE) SARS-CoV-2 target nucleic acids are NOT DETECTED.  The SARS-CoV-2 RNA is generally detectable in upper respiratory specimens during the acute phase of infection. The lowest concentration of SARS-CoV-2 viral copies this assay can detect is 138 copies/mL. A negative result does not preclude SARS-Cov-2 infection and should not be used as the sole basis for treatment or other patient management decisions. A negative result may occur with  improper specimen collection/handling, submission of specimen other than nasopharyngeal swab, presence of viral mutation(s) within the areas targeted by this assay, and inadequate number of viral copies(<138 copies/mL). A negative result must be combined with clinical observations, patient history, and epidemiological information. The expected result is Negative.  Fact Sheet for Patients:  EntrepreneurPulse.com.au  Fact Sheet for Healthcare Providers:  IncredibleEmployment.be  This test is no t yet approved or cleared by the [place] FDA and  has been authorized for detection and/or diagnosis of SARS-CoV-2 by FDA under an Emergency Use Authorization (EUA). This EUA will remain  in effect (meaning this test can be used) for the duration of the COVID-19 declaration under Section 564(b)(1) of the Act, 21 U.S.C.section 360bbb-3(b)(1), unless the authorization is terminated  or revoked sooner.       Influenza A by PCR NEGATIVE NEGATIVE Final   Influenza B by PCR NEGATIVE NEGATIVE Final     Comment: (NOTE) The Xpert Xpress SARS-CoV-2/FLU/RSV plus assay is intended as an aid in the diagnosis of influenza from Nasopharyngeal swab specimens and should not be used as a sole basis for treatment. Nasal washings and aspirates are unacceptable for Xpert Xpress SARS-CoV-2/FLU/RSV testing.  Fact Sheet for Patients: EntrepreneurPulse.com.au  Fact Sheet for Healthcare Providers: IncredibleEmployment.be  This test is not yet approved or cleared by the [place] FDA and has been authorized for detection and/or diagnosis of SARS-CoV-2 by FDA under an Emergency Use Authorization (EUA). This EUA will remain in effect (meaning this test can be used) for the duration of the COVID-19 declaration under Section 564(b)(1) of the Act, 21 U.S.C. section 360bbb-3(b)(1), unless the authorization is terminated or revoked.  Performed at [facility], [place] [address] [place], [place] [address]     Labs: CBC: Recent Labs  Lab 12/27/21 0243 12/29/21 0158 12/30/21 0508  WBC 7.9 7.5 6.5  NEUTROABS 5.1  --   --   HGB 15.1 14.8 14.6  HCT 45.7 44.4 44.1  MCV 90.0 90.6 91.1  PLT 179 162 505   Basic Metabolic Panel: Recent Labs  Lab 12/27/21 0243 12/29/21 1139  NA 142 136  K 3.7 4.5  CL 103 103  CO2 28 25  GLUCOSE 146* 119*  BUN 19 16  CREATININE 0.77 0.59*  CALCIUM 9.2 8.8*  MG 1.8 2.3  PHOS  --  3.1   Liver Function Tests: Recent Labs  Lab 12/27/21 0243  AST 47*  ALT 80*  ALKPHOS 54  BILITOT 0.5  PROT 7.4  ALBUMIN 3.9   Discharge time spent: greater than 30 minutes.  Signed: Fritzi Mandes, MD [facility] 12/30/2021

## 2021-12-30 NOTE — Progress Notes (Signed)
Discussed discharge instructions including medications and follow appointments.  AVS instruction sent home with patient

## 2021-12-30 NOTE — Transfer of Care (Signed)
Immediate Anesthesia Transfer of Care Note  Patient: Stephen Sherman  Procedure(s) Performed: CARDIOVERSION TRANSESOPHAGEAL ECHOCARDIOGRAM (TEE)  Patient Location: PACU  Anesthesia Type:General  Level of Consciousness: awake and drowsy  Airway & Oxygen Therapy: Patient Spontanous Breathing and Patient connected to nasal cannula oxygen  Post-op Assessment: Report given to RN and Post -op Vital signs reviewed and stable  Post vital signs: Reviewed and stable  Last Vitals:  Vitals Value Taken Time  BP 91/59 12/30/21 0811  Temp    Pulse 67 12/30/21 0811  Resp 18 12/30/21 0811  SpO2 93 % 12/30/21 0811    Last Pain:  Vitals:   12/30/21 0731  TempSrc: Oral  PainSc:          Complications: No notable events documented.

## 2021-12-30 NOTE — Progress Notes (Signed)
ANTICOAGULATION CONSULT NOTE  Pharmacy Consult for heparin infusion Indication: A-Fib (A-flutter w/ RVR and elevated troponin)  Allergies  Allergen Reactions   Morphine Nausea Only    Patient Measurements: Height: 5\' 11"  (180.3 cm) Weight: 128.5 kg (283 lb 4.7 oz) IBW/kg (Calculated) : 75.3 Heparin Dosing Weight: 104.4 kg  Vital Signs: Temp: 98.1 F (36.7 C) (02/14 0143) Temp Source: Oral (02/14 0143) BP: 100/80 (02/14 0143) Pulse Rate: 98 (02/14 0143)  Labs: Recent Labs    12/27/21 0740 12/27/21 0930 12/27/21 1428 12/28/21 1913 12/29/21 0158 12/29/21 1139 12/30/21 0508  HGB  --   --   --   --  14.8  --  14.6  HCT  --   --   --   --  44.4  --  44.1  PLT  --   --   --   --  162  --  160  HEPARINUNFRC  --   --    < > 0.45 0.45  --  0.41  CREATININE  --   --   --   --   --  0.59*  --   TROPONINIHS 103* 74*  --   --   --   --   --    < > = values in this interval not displayed.     Estimated Creatinine Clearance: 127.5 mL/min (A) (by C-G formula based on SCr of 0.59 mg/dL (L)).   Medical History: Past Medical History:  Diagnosis Date   Atrial flutter (HCC)    CAD (coronary artery disease)    Cancer (HCC)    Prostate   HLD (hyperlipidemia)     Assessment: Pt is 66 yo male with h/o A-fib presenting to ED c/o being woke up by sudden onset of chest discomfort, found with A-flutter and slightly elevated Troponin I.  Date Time HL Rate/comment 2/11 1428 0.17 1500 un/hr, subthera 2/11 2025 0.38 1800 un/hr, therapeutic 2/12 0236 0.31 Slightly therapeutic,downward trend 2/12 1154 0.28 Subtherapeutic 2/12 1913 0.45 2100 un/hr, therapeutic 2/13 0158 0.45 2/14     0508   0.41       Goal of Therapy:  Heparin level 0.3-0.7 units/ml Monitor platelets by anticoagulation protocol: Yes   Plan:  2/14: HL @ 0508 = 0.41, therapeutic X 3  Will continue pt on current rate and recheck HL on 2/15 with AM labs.   Oliwia Berzins D 12/30/2021 6:21 AM

## 2021-12-30 NOTE — CV Procedure (Signed)
Cardioversion procedure note For atrial flutter, persistent  Procedure Details:  Consent: Risks of procedure as well as the alternatives and risks of each were explained to the (patient/caregiver).  Consent for procedure obtained.  Time Out: Verified patient identification, verified procedure, site/side was marked, verified correct patient position, special equipment/implants available, medications/allergies/relevent history reviewed, required imaging and test results available.  Performed  Patient placed on cardiac monitor, pulse oximetry, supplemental oxygen as necessary.   Sedation given: propofol IV, Dr. Erenest Rasher Pacer pads placed anterior and posterior chest.   Cardioverted 1 time(s).   Cardioverted at  150 J. Synchronized biphasic Converted to NSR   Evaluation: Findings: Post procedure EKG shows: NSR Complications: None Patient did tolerate procedure well.  Time Spent Directly with the Patient:  63 minutes   Esmond Plants, M.D., Ph.D.

## 2021-12-30 NOTE — Progress Notes (Signed)
*  PRELIMINARY RESULTS* Echocardiogram Echocardiogram Transesophageal has been performed.  Stephen Sherman 12/30/2021, 8:13 AM

## 2021-12-30 NOTE — Progress Notes (Addendum)
Transesophageal Echocardiogram :  Indication: Atrial flutter, typical Requesting/ordering  physician: Dr. Fletcher Anon  Procedure: Benzocaine spray x2 and 2 mls x 2 of viscous lidocaine were given orally to provide local anesthesia to the oropharynx. The patient was positioned supine on the left side, bite block provided. The patient was moderately sedated with the doses of versed and fentanyl as detailed below.  Using digital technique an omniplane probe was advanced into the distal esophagus without incident.   Moderate to general sedation: 1. Sedation used: Per anesthesia, propofol used Challenging and limited images secondary to patient agitation  See report in EPIC  for complete details: In brief, transgastric imaging revealed normal LV function with no RWMAs and no mural apical thrombus.  .  Estimated ejection fraction was 40 to 50%.  Right sided cardiac chambers were normal with no evidence of pulmonary hypertension.  Imaging of the septum showed no ASD or VSD Bubble study was negative for shunt 2D and color flow confirmed no PFO  The LA was well visualized in orthogonal views.  There was no spontaneous contrast and no thrombus in the LA and LA appendage   The descending thoracic aorta had no  mural aortic debris with no evidence of aneurysmal dilation or disection   Stephen Sherman 12/30/2021 8:17 AM

## 2021-12-30 NOTE — Progress Notes (Signed)
Progress Note  Patient Name: Stephen Sherman Date of Encounter: 12/30/2021  [facility] Cardiologist: Nelva Bush, MD   Subjective   Feels well status post successful TEE-guided cardioversion this morning.  No chest pain, shortness of breath, or palpitations.  Inpatient Medications    Scheduled Meds:  apixaban  5 mg Oral BID   aspirin  324 mg Oral Once   aspirin EC  81 mg Oral Daily   butamben-tetracaine-benzocaine       flecainide  75 mg Oral P38S   folic acid  1 mg Oral Daily   lidocaine       LORazepam  0-4 mg Intravenous Q12H   metoprolol succinate  25 mg Oral Daily   multivitamin with minerals  1 tablet Oral Daily   rosuvastatin  20 mg Oral Daily   sodium chloride flush       tamsulosin  0.4 mg Oral Daily   thiamine  100 mg Oral Daily   Or   thiamine  100 mg Intravenous Daily   Continuous Infusions:  PRN Meds: acetaminophen, metoprolol tartrate, ondansetron (ZOFRAN) IV   Vital Signs    Vitals:   12/30/21 0820 12/30/21 0830 12/30/21 0845 12/30/21 0900  BP: 91/64 101/60 (!) 95/53 (!) 90/55  Pulse: 66 70 (!) 54 60  Resp: 16 17 16 15   Temp:      TempSrc:      SpO2: 95% 94% 93% 95%  Weight:      Height:        Intake/Output Summary (Last 24 hours) at 12/30/2021 0931 Last data filed at 12/30/2021 0814 Gross per 24 hour  Intake 1376.39 ml  Output 0 ml  Net 1376.39 ml   Last 3 Weights 12/27/2021 01/24/2018 08/18/2017  Weight (lbs) 283 lb 4.7 oz 276 lb 276 lb  Weight (kg) 128.5 kg 125.193 kg 125.193 kg      Telemetry    Sinus rhythm following cardioversion this morning- Personally Reviewed  ECG    Normal sinus rhythm with mild first-degree AV block.- Personally Reviewed  Physical Exam   GEN: No acute distress.   Neck: No JVD Cardiac: RRR, no murmurs, rubs, or gallops.  Respiratory: Clear to auscultation bilaterally. GI: Soft, nontender, non-distended  MS: No edema; No deformity. Neuro:  Nonfocal  Psych: Normal affect   Labs    High  Sensitivity Troponin:   Recent Labs  Lab 12/27/21 0243 12/27/21 0507 12/27/21 0740 12/27/21 0930  TROPONINIHS 24* 115* 103* 74*     Chemistry Recent Labs  Lab 12/27/21 0243 12/29/21 1139  NA 142 136  K 3.7 4.5  CL 103 103  CO2 28 25  GLUCOSE 146* 119*  BUN 19 16  CREATININE 0.77 0.59*  CALCIUM 9.2 8.8*  MG 1.8 2.3  PROT 7.4  --   ALBUMIN 3.9  --   AST 47*  --   ALT 80*  --   ALKPHOS 54  --   BILITOT 0.5  --   GFRNONAA >60 >60  ANIONGAP 11 8    Lipids  Recent Labs  Lab 12/28/21 0236  CHOL 204*  TRIG 185*  HDL 39*  LDLCALC 128*  CHOLHDL 5.2    Hematology Recent Labs  Lab 12/27/21 0243 12/29/21 0158 12/30/21 0508  WBC 7.9 7.5 6.5  RBC 5.08 4.90 4.84  HGB 15.1 14.8 14.6  HCT 45.7 44.4 44.1  MCV 90.0 90.6 91.1  MCH 29.7 30.2 30.2  MCHC 33.0 33.3 33.1  RDW 14.2 14.2 14.4  PLT  179 162 160   Thyroid No results for input(s): TSH, FREET4 in the last 168 hours.  BNPNo results for input(s): BNP, PROBNP in the last 168 hours.  DDimer No results for input(s): DDIMER in the last 168 hours.   Radiology    ECHOCARDIOGRAM COMPLETE  Result Date: 12/28/2021    ECHOCARDIOGRAM REPORT   Patient Name:   Stephen Sherman Date of Exam: 12/28/2021 Medical Rec #:  [id number]   Height:       71.0 in Accession #:    [id number]  Weight:       283.3 lb Date of Birth:  1956-01-12   BSA:          2.445 m Patient Age:    36 years    BP:           108/75 mmHg Patient Gender: M           HR:           113 bpm. Exam Location:  [facility] Procedure: 2D Echo and Intracardiac Opacification Agent Indications:     Atrial Flutter  History:         Patient has no prior history of Echocardiogram examinations.                  CAD; Risk Factors:Dyslipidemia and Obesity.  Sonographer:     L Thornton-Maynard Referring Phys:  [id number] [facility] Diagnosing Phys: Skeet Latch MD  Sonographer Comments: Image acquisition challenging due to patient body habitus. IMPRESSIONS  1. Left ventricular ejection  fraction, by estimation, is 45 to 50%. The left ventricle has mildly decreased function. The left ventricle demonstrates regional wall motion abnormalities (see scoring diagram/findings for description). Left ventricular diastolic parameters are indeterminate.  2. Right ventricular systolic function is normal. The right ventricular size is normal.  3. The mitral valve is normal in structure. Trivial mitral valve regurgitation. No evidence of mitral stenosis.  4. The aortic valve is normal in structure. Aortic valve regurgitation is not visualized. No aortic stenosis is present. FINDINGS  Left Ventricle: Image quality is poor. Large epicarcdial fat pad. Mild global hypokinesis worse in the anterolateral, apical anterior and apical myocardium. Left ventricular ejection fraction, by estimation, is 45 to 50%. The left ventricle has mildly decreased function. The left ventricle demonstrates regional wall motion abnormalities. Definity contrast agent was given IV to delineate the left ventricular endocardial borders. The left ventricular internal cavity size was normal in size. There is no left ventricular hypertrophy. Left ventricular diastolic parameters are indeterminate. Right Ventricle: The right ventricular size is normal. No increase in right ventricular wall thickness. Right ventricular systolic function is normal. Left Atrium: Left atrial size was normal in size. Right Atrium: Right atrial size was normal in size. Pericardium: There is no evidence of pericardial effusion. Mitral Valve: The mitral valve is normal in structure. Trivial mitral valve regurgitation. No evidence of mitral valve stenosis. Tricuspid Valve: The tricuspid valve is normal in structure. Tricuspid valve regurgitation is not demonstrated. No evidence of tricuspid stenosis. Aortic Valve: The aortic valve is normal in structure. Aortic valve regurgitation is not visualized. No aortic stenosis is present. Aortic valve mean gradient measures 2.5  mmHg. Aortic valve peak gradient measures 4.0 mmHg. Aortic valve area, by VTI measures 2.39 cm. Pulmonic Valve: The pulmonic valve was normal in structure. Pulmonic valve regurgitation is not visualized. No evidence of pulmonic stenosis. Aorta: The aortic root is normal in size and structure. Venous: IVC assessment for right atrial pressure  unable to be performed due to mechanical ventilation. IAS/Shunts: No atrial level shunt detected by color flow Doppler.  LEFT VENTRICLE PLAX 2D LVOT diam:     2.20 cm     Diastology LV SV:         40          LV e' medial:    12.40 cm/s LV SV Index:   16          LV E/e' medial:  5.8 LVOT Area:     3.80 cm    LV e' lateral:   17.20 cm/s                            LV E/e' lateral: 4.2  LV Volumes (MOD) LV vol d, MOD A2C: 66.5 ml LV vol d, MOD A4C: 99.8 ml LV vol s, MOD A2C: 30.7 ml LV vol s, MOD A4C: 40.2 ml LV SV MOD A2C:     35.8 ml LV SV MOD A4C:     99.8 ml LV SV MOD BP:      45.1 ml LEFT ATRIUM             Index        RIGHT ATRIUM           Index LA Vol (A2C):   34.9 ml 14.28 ml/m  RA Area:     20.20 cm LA Vol (A4C):   54.9 ml 22.46 ml/m  RA Volume:   56.70 ml  23.19 ml/m LA Biplane Vol: 43.8 ml 17.92 ml/m  AORTIC VALVE                    PULMONIC VALVE AV Area (Vmax):    2.70 cm     PV Vmax:       0.69 m/s AV Area (Vmean):   2.36 cm     PV Peak grad:  1.9 mmHg AV Area (VTI):     2.39 cm AV Vmax:           100.00 cm/s AV Vmean:          77.950 cm/s AV VTI:            0.168 m AV Peak Grad:      4.0 mmHg AV Mean Grad:      2.5 mmHg LVOT Vmax:         71.10 cm/s LVOT Vmean:        48.300 cm/s LVOT VTI:          0.106 m LVOT/AV VTI ratio: 0.63 MITRAL VALVE MV Area (PHT): 4.93 cm    SHUNTS MV Decel Time: 154 msec    Systemic VTI:  0.11 m MV E velocity: 71.60 cm/s  Systemic Diam: 2.20 cm Skeet Latch MD Electronically signed by Skeet Latch MD Signature Date/Time: 12/28/2021/2:36:05 PM    Final     Cardiac Studies   TEE (12/30/2021-prelim): LVEF 55%.  Normal  RV size and function.  No intracardiac thrombus.  Patient Profile     66 y.o. male with history of CAD, paroxysmal atrial flutter, hyperlipidemia, sleep apnea, and prostate cancer, admitted with atrial flutter.  Assessment & Plan    Atrial flutter: Patient's status post successful TEE-guided cardioversion.  Flecainide increased to 75 mg twice daily yesterday. -Continue flecainide 75 mg twice daily. -Discontinue diltiazem infusion now the patient is in sinus rhythm. -Switch metoprolol to tartrate 12.5 mg twice daily to metoprolol  succinate 25 mg daily. -Transition IV heparin to apixaban 5 mg twice daily, to be continued at least 1-2 months in the setting of a CHA2DS2-VASc score of 1-2.  Cardiomyopathy: LVEF reported as mildly reduced in setting of rapid atrial flutter.  TEE today notes LVEF 55%. -Transition metoprolol to tartrate to metoprolol succinate 25 mg daily. -Consider repeating limited echo as outpatient to confirm normal LVEF.  Demand ischemia: Mildly elevated troponin on admission likely due to supply-demand mismatch in the setting of atrial flutter with rapid ventricular response. -Consider outpatient coronary CTA versus MPI. -Continue low-dose aspirin for now.  If no evidence of severe CAD, could discontinue in the future if patient is to remain on long-term anticoagulation.  Patient is stable for discharge home today once he has been transitioned to oral anticoagulation.  We will have him follow-up in our office with me or an APP in approximately 2 weeks.  For questions or updates, please contact [place] Please consult [email or web address] for contact info under [facility].     Signed, Nelva Bush, MD  12/30/2021, 9:31 AM

## 2021-12-31 ENCOUNTER — Encounter: Payer: Self-pay | Admitting: Cardiovascular Disease

## 2022-01-06 ENCOUNTER — Encounter: Payer: Self-pay | Admitting: Medical

## 2022-01-06 ENCOUNTER — Telehealth: Payer: Self-pay | Admitting: Medical

## 2022-01-06 ENCOUNTER — Ambulatory Visit: Payer: No Typology Code available for payment source | Admitting: Medical

## 2022-01-06 ENCOUNTER — Other Ambulatory Visit: Payer: Self-pay

## 2022-01-06 VITALS — BP 122/70 | HR 72 | Ht 71.5 in | Wt 277.0 lb

## 2022-01-06 DIAGNOSIS — I5022 Chronic systolic (congestive) heart failure: Secondary | ICD-10-CM | POA: Diagnosis not present

## 2022-01-06 DIAGNOSIS — I2584 Coronary atherosclerosis due to calcified coronary lesion: Secondary | ICD-10-CM

## 2022-01-06 DIAGNOSIS — I429 Cardiomyopathy, unspecified: Secondary | ICD-10-CM

## 2022-01-06 DIAGNOSIS — G479 Sleep disorder, unspecified: Secondary | ICD-10-CM

## 2022-01-06 DIAGNOSIS — I2510 Atherosclerotic heart disease of native coronary artery without angina pectoris: Secondary | ICD-10-CM

## 2022-01-06 DIAGNOSIS — I4892 Unspecified atrial flutter: Secondary | ICD-10-CM | POA: Diagnosis not present

## 2022-01-06 DIAGNOSIS — R072 Precordial pain: Secondary | ICD-10-CM

## 2022-01-06 DIAGNOSIS — R7989 Other specified abnormal findings of blood chemistry: Secondary | ICD-10-CM

## 2022-01-06 DIAGNOSIS — R778 Other specified abnormalities of plasma proteins: Secondary | ICD-10-CM

## 2022-01-06 DIAGNOSIS — E782 Mixed hyperlipidemia: Secondary | ICD-10-CM

## 2022-01-06 MED ORDER — REPATHA 140 MG/ML ~~LOC~~ SOSY: 140.0000 mg | PREFILLED_SYRINGE | SUBCUTANEOUS | 3 refills | Status: DC

## 2022-01-06 MED ORDER — PRALUENT 75 MG/ML ~~LOC~~ SOAJ: 1.0000 mL | SUBCUTANEOUS | 1 refills | Status: DC

## 2022-01-06 NOTE — Telephone Encounter (Signed)
Received a fax stating [employer] also will need a PA. PA has been submitted via covermymeds but may require some additional questions to be answered.   Your PA has been faxed to the plan as a paper copy. Please contact the plan directly if you haven't received a determination in a typical timeframe.  You will be notified of the determination via fax.

## 2022-01-06 NOTE — Telephone Encounter (Signed)
Pharmacy is calling stating Rep[facility] requires a PA, asks if [employer] can be substituted. Please call and advise.

## 2022-01-06 NOTE — Progress Notes (Signed)
Cardiology Office Note:    Date:  01/07/2022   ID:  Stephen Sherman, DOB 02-Sep-1956, MRN [id number]  PCP:  [name], MD  [facility] Cardiologist:  Nelva Bush, MD  [facility] Electrophysiologist:  None   Referring MD: [name], MD   Chief Complaint: f/u hospitlization  History of Present Illness:    Stephen Sherman is a 66 y.o. male with a hx of with history of CAD, paroxysmal atrial flutter, hyperlipidemia, alcohol use, sleep apnea, and prostate cancer, recently admitted with atrial flutter.  The patient was previously followed by Dr. Naida Sleight at [facility]. He was last seen 03/2020.  He previously had a successful TEE/DCCV on 11/2019.  Echo revealed LVEF >55%, moderate LA enlargement.  He had recurrent episode later that month.  He was given flecainide to take as a pill in the pocket approach.  When he took the flecainide it slowed his heart rate but he did not convert into sinus rhythm.  He subsequently spontaneously converted. No known recurrent atrial fibrillation/flutter since that time.   He was started on rosuvastatin for coronary calcium noted on chest CT.  He was not continued on anticoagulation given that his CHADS2 Vascor was 1.  They encouraged him to abstain from alcohol and lose weight.  He was also referred for sleep study but it was never performed.   More recently he was admitted February 2023 with palpitations found to be in rapid Aflutter, started on IV heparin and IV cardizem with improvement of heart rate. CHADSVASC of 1. Flecainide was increased. Echo showed LVEF 45-50%.  The patient underwent successful TEE/DCCV. TEE showed LVEF 55%. The patient was discharged on Toprol, flecainide and Eliquis.     Today the patient reports he has been doing well since discharge. No chest pain, sob, LLE, orthopnea, pnd, palpitations. Echo, TEE reviewed in the visit. Many questions about hospitalization answered. EKG  today shows SR. Patient is unsure whether he will continue to  follow with [facility] or go back to [facility].   Past Medical History:  Diagnosis Date   Atrial flutter ([place])    CAD (coronary artery disease)    Cancer ([place])    Prostate   HLD (hyperlipidemia)     Past Surgical History:  Procedure Laterality Date   CARDIOVERSION N/A 12/30/2021   Procedure: CARDIOVERSION;  Surgeon: Minna Merritts, MD;  Location: [facility] ORS;  Service: Cardiovascular;  Laterality: N/A;   COLONOSCOPY  2012   GUM SURGERY  2009,2012   HERNIA REPAIR Bilateral 2012   INGUNIAL   ORIF ANKLE FRACTURE Left 08/18/2017   Procedure: OPEN REDUCTION INTERNAL FIXATION (ORIF) ANKLE FRACTURE;  Surgeon: Earnestine Leys, MD;  Location: [facility] ORS;  Service: Orthopedics;  Laterality: Left;   TEE WITHOUT CARDIOVERSION N/A 12/30/2021   Procedure: TRANSESOPHAGEAL ECHOCARDIOGRAM (TEE);  Surgeon: Minna Merritts, MD;  Location: [facility] ORS;  Service: Cardiovascular;  Laterality: N/A;    Current Medications: Current Meds  Medication Sig   apixaban (ELIQUIS) 5 MG TABS tablet Take 1 tablet (5 mg total) by mouth 2 (two) times daily.   aspirin EC 81 MG EC tablet Take 1 tablet (81 mg total) by mouth daily. Swallow whole.   flecainide (TAMBOCOR) 150 MG tablet Take 0.5 tablets (75 mg total) by mouth every 12 (twelve) hours.   metoprolol succinate (TOPROL-XL) 25 MG 24 hr tablet Take 1 tablet (25 mg total) by mouth daily.   rosuvastatin (CRESTOR) 5 MG tablet Take 1 tablet by mouth daily.   tamsulosin (FLOMAX)  0.4 MG CAPS capsule Take 0.4 mg by mouth daily.   vitamin E 400 UNIT capsule Take 400 Units by mouth daily.   [DISCONTINUED] Evolocumab (REPATHA) 140 MG/ML SOSY Inject 140 mg into the skin every 14 (fourteen) days.     Allergies:   Morphine   Social History   Socioeconomic History   Marital status: Single    Spouse name: Not on file   Number of children: Not on file   Years of education: Not on file   Highest education level: Not on file  Occupational History   Not on file  Tobacco Use   Smoking  status: Former    Packs/day: 1.00    Years: 28.00    Pack years: 28.00    Types: Cigarettes    Quit date: 12/17/2003    Years since quitting: 18.0   Smokeless tobacco: Never  Vaping Use   Vaping Use: Never used  Substance and Sexual Activity   Alcohol use: Not Currently    Comment: 2+ beers a day.   Drug use: No   Sexual activity: Not on file  Other Topics Concern   Not on file  Social History Narrative   Not on file   Social Determinants of Health   Financial Resource Strain: Not on file  Food Insecurity: Not on file  Transportation Needs: Not on file  Physical Activity: Not on file  Stress: Not on file  Social Connections: Not on file     Family History: The patient's family history includes Atrial fibrillation in his brother, sister, and sister; Diabetes in his mother.  ROS:   Please see the history of present illness.     All other systems reviewed and are negative.  EKGs/Labs/Other Studies Reviewed:    The following studies were reviewed today:  TEE 12/2021   1. Left ventricular ejection fraction, by estimation, is 40-50%. Limited  views obtained secondary to patient agitation. The left ventricle has  mildly decreased function. The left ventricle demonstrates global  hypokinesis.   2. Right ventricular systolic function is normal. The right ventricular  size is normal.   3. No left atrial/left atrial appendage thrombus was detected.   4. The mitral valve is normal in structure. Mild mitral valve  regurgitation. No evidence of mitral stenosis.   5. The aortic valve is tricuspid. Aortic valve regurgitation is not  visualized. No aortic stenosis is present.   6. The inferior vena cava is normal in size with greater than 50%  respiratory variability, suggesting right atrial pressure of 3 mmHg.   7. Agitated saline contrast bubble study was negative, with no evidence  of any interatrial shunt.   Echo 12/28/21  1. Left ventricular ejection fraction, by  estimation, is 45 to 50%. The  left ventricle has mildly decreased function. The left ventricle  demonstrates regional wall motion abnormalities (see scoring  diagram/findings for description). Left ventricular  diastolic parameters are indeterminate.   2. Right ventricular systolic function is normal. The right ventricular  size is normal.   3. The mitral valve is normal in structure. Trivial mitral valve  regurgitation. No evidence of mitral stenosis.   4. The aortic valve is normal in structure. Aortic valve regurgitation is  not visualized. No aortic stenosis is present.   EKG:  EKG is  ordered today.  The ekg ordered today demonstrates NSR, 72bpm, nonspecific T wave changes  Recent Labs: 12/27/2021: ALT 80 12/29/2021: Magnesium 2.3 01/06/2022: BUN 20; Creatinine, Ser 0.73; Hemoglobin 14.4; Platelets  202; Potassium 4.4; Sodium 142  Recent Lipid Panel    Component Value Date/Time   CHOL 204 (H) 12/28/2021 0236   TRIG 185 (H) 12/28/2021 0236   HDL 39 (L) 12/28/2021 0236   CHOLHDL 5.2 12/28/2021 0236   VLDL 37 12/28/2021 0236   LDLCALC 128 (H) 12/28/2021 0236      Physical Exam:    VS:  BP 122/70 (BP Location: Left Arm, Patient Position: Sitting, Cuff Size: Large)    Pulse 72    Ht 5' 11.5" (1.816 m)    Wt 277 lb (125.6 kg)    SpO2 97%    BMI 38.10 kg/m     Wt Readings from Last 3 Encounters:  01/06/22 277 lb (125.6 kg)  12/27/21 283 lb 4.7 oz (128.5 kg)  01/24/18 276 lb (125.2 kg)     GEN:  Well nourished, well developed in no acute distress HEENT: Normal NECK: No JVD; No carotid bruits LYMPHATICS: No lymphadenopathy CARDIAC: RRR, no murmurs, rubs, gallops RESPIRATORY:  Clear to auscultation without rales, wheezing or rhonchi  ABDOMEN: Soft, non-tender, non-distended MUSCULOSKELETAL:  No edema; No deformity  SKIN: Warm and dry NEUROLOGIC:  Alert and oriented x 3 PSYCHIATRIC:  Normal affect   ASSESSMENT:    1. Atrial flutter, unspecified type ([place])   2.  Precordial pain   3. Chronic systolic heart failure (HCC)   4. Cardiomyopathy, unspecified type (HCC)   5. Elevated troponin   6. Coronary artery calcification   7. Hyperlipidemia, mixed   8. Sleep disorder    PLAN:    In order of problems listed above:  Aflutter In SR on EKG. Continue flecainide 75mg  BID. Qtc stable at 409 ms. I will refer to EP for medication management and possible ablation. Continue Eliquis 5mg  BID for stroke ppx. CBC today.   Cardiomyopathy LVEF 40-45 in the setting of rapid aflutter. TEE showed LVEF 50-55%. Re-check echo to confirm pump function. Will wait on further GDMT until after the echo. Continue Toprol.   Troponin elevation Coronary artery calcifications Troponin elevated  to 115 in the hospital in the setting of rapid aflutter, chest burning reported. EKG with no ischemic changes. I will get a cardiac CTA to further evaluate for ischemia. He is on Aspirin and Toprol. Does not likely need Aspirin given Eliquis.   HLD LDL 128, goal<70. Patient is on Crestor 5mg  and reports moderate joint pain. We will put in for Repatha.   Sleep disorder Patient not wanting a sleep study, I will order WATCHPAT. STOPBANG score 6. May need to refer to pulmonology.   Sleep Apnea Evaluation   [facility]  Today's Date: 01/07/2022   Patient Name: Stephen Sherman        DOB: [birth year]       Height:  5' 11.5" (1.816 m)     Weight: 277 lb (125.6 kg)  BMI: Body mass index is 38.1 kg/m.    Referring Provider:  Yomaris Palecek   STOP-BANG RISK ASSESSMENT    STOP-BANG 01/06/2022  Do you snore loudly? Yes  Do you often feel tired, fatigued, or sleepy during the daytime? Yes  Has anyone observed you stop breathing during sleep? No  Do you have (or are you being treated for) high blood pressure? No  Recent BMI (Calculated) 38.1  Is BMI greater than 35 kg/m2? 1=Yes  Age older than 65 years old? 1=Yes  Has large neck size > 40 cm (15.7 in, large male  shirt size, large male collar  size > 16) -  Gender - Male 1=Yes  STOP-Bang Total Score -      If STOP-BANG Score ?3 OR two clinical symptoms - patient qualifies for WatchPAT (CPT 95800)      Sleep study ordered due to two (2) of the following clinical symptoms/diagnoses:  Excessive daytime sleepiness G47.10   Gastroesophageal reflux K21.9   Nocturia R35.1   Morning Headaches G44.221   Difficulty concentrating R41.840   Memory problems or poor judgment G31.84   Personality changes or irritability R45.4   Loud snoring R06.83   Depression F32.9   Unrefreshed by sleep G47.8   Impotence N52.9   History of high blood pressure R03.0   Insomnia G47.00   Sleep Disordered Breathing or Sleep Apnea ICD G47.33      Disposition: Follow up in 6-8 week(s) with MD/APP     Signed, Tom Ragsdale Ninfa Meeker, PA-C  01/07/2022 3:27 PM    [facility]

## 2022-01-06 NOTE — Patient Instructions (Signed)
Medication Instructions:  Your physician has recommended you make the following change in your medication:   START taking evolocumab (Repatha) 140 mg every 2 weeks. This will have to be authorized by your insurance company. We will let you know if it is approved or not.   *If you need a refill on your cardiac medications before your next appointment, please call your pharmacy*   Lab Work:  Your provider has ordered labs (CBC, BMET) to be drawn before you leave today.   If you have labs (blood work) drawn today and your tests are completely normal, you will receive your results only by: [place] (if you have MyChart) OR A paper copy in the mail If you have any lab test that is abnormal or we need to change your treatment, we will call you to review the results.   Testing/Procedures:  Echocardiogram - Your physician has requested that you have an echocardiogram. Echocardiography is a painless test that uses sound waves to create images of your heart. It provides your doctor with information about the size and shape of your heart and how well your hearts chambers and valves are working. This procedure takes approximately one hour. There are no restrictions for this procedure.   2. Your cardiac CT has been scheduled for March 2nd, 2023 at [facility] at:  [facility] [place], [place] [address] [phone]  Please arrive 15 mins early for check-in and test prep.    Please follow these instructions carefully (unless otherwise directed):   On the Night Before the Test: Be sure to Drink plenty of water. Do not consume any caffeinated/decaffeinated beverages or chocolate 12 hours prior to your test.  On the Day of the Test: Drink plenty of water until 1 hour prior to the test. Do not eat any food 4 hours prior to the test. You may take your regular medications prior to the test.  Take metoprolol (Lopressor) two hours  prior to test.       After the Test: Drink plenty of water. After receiving IV contrast, you may experience a mild flushed feeling. This is normal. On occasion, you may experience a mild rash up to 24 hours after the test. This is not dangerous. If this occurs, you can take Benadryl 25 mg and increase your fluid intake. If you experience trouble breathing, this can be serious. If it is severe call 911 IMMEDIATELY. If it is mild, please call our office.  Please allow 2-4 weeks for scheduling of routine cardiac CTs. Some insurance companies require a pre-authorization which may delay scheduling of this test.   For non-scheduling related questions, please contact the cardiac imaging nurse navigator should you have any questions/concerns: Marchia Bond, Cardiac Imaging Nurse Navigator Gordy Clement, Cardiac Imaging Nurse Navigator  [facility] Direct Office Dial: [phone]   For scheduling needs, including cancellations and rescheduling, please call [place], [phone].    3. WatchPAT?  Is a FDA cleared portable home sleep study test that uses a watch and 3 points of contact to monitor 7 different channels, including your heart rate, oxygen saturations, body position, snoring, and chest motion.  The study is easy to use from the comfort of your own home and accurately detect sleep apnea.  Before bed, you attach the chest sensor, attached the sleep apnea bracelet to your nondominant hand, and attach the finger probe.  After the study, the raw data is downloaded from the watch and  scored for apnea events.   For more information: [email or web address]    Follow-Up: At [facility], you and your health needs are our priority.  As part of our continuing mission to provide you with exceptional heart care, we have created designated Provider Care Teams.  These Care Teams include your primary Cardiologist (physician) and Advanced Practice Providers  (APPs -  Physician Assistants and Nurse Practitioners) who all work together to provide you with the care you need, when you need it.  We recommend signing up for the patient portal called "MyChart".  Sign up information is provided on this After Visit Summary.  MyChart is used to connect with patients for Virtual Visits (Telemedicine).  Patients are able to view lab/test results, encounter notes, upcoming appointments, etc.  Non-urgent messages can be sent to your provider as well.   To learn more about what you can do with MyChart, go to [email or web address].    Your next appointment:   6-8 week(s)  The format for your next appointment:   In Person  Provider:   You may see Nelva Bush, MD or one of the following Advanced Practice Providers on your designated Care Team:   Murray Hodgkins, NP Christell Faith, PA-C Cadence Jorene Minors   Other Instructions Referral to electrophysiology

## 2022-01-06 NOTE — Telephone Encounter (Signed)
Spoke with Leafy Ro at [employer] and informed her that it is OK to switch Repatha to [employer] per [employer], PA-C.   Praluent 75 mg/mL inject 83mL Q 14 days

## 2022-01-07 ENCOUNTER — Other Ambulatory Visit: Payer: Self-pay | Admitting: Medical

## 2022-01-07 DIAGNOSIS — I429 Cardiomyopathy, unspecified: Secondary | ICD-10-CM

## 2022-01-07 DIAGNOSIS — L97529 Non-pressure chronic ulcer of other part of left foot with unspecified severity: Secondary | ICD-10-CM

## 2022-01-07 LAB — BASIC METABOLIC PANEL
BUN/Creatinine Ratio: 27 — ABNORMAL HIGH (ref 10–24)
BUN: 20 mg/dL (ref 8–27)
CO2: 25 mmol/L (ref 20–29)
Calcium: 9.1 mg/dL (ref 8.6–10.2)
Chloride: 102 mmol/L (ref 96–106)
Creatinine, Ser: 0.73 mg/dL — ABNORMAL LOW (ref 0.76–1.27)
Glucose: 100 mg/dL — ABNORMAL HIGH (ref 70–99)
Potassium: 4.4 mmol/L (ref 3.5–5.2)
Sodium: 142 mmol/L (ref 134–144)
eGFR: 102 mL/min/{1.73_m2} (ref >59)

## 2022-01-07 LAB — CBC
Hematocrit: 43.4 % (ref 37.5–51.0)
Hemoglobin: 14.4 g/dL (ref 13.0–17.7)
MCH: 29.4 pg (ref 26.6–33.0)
MCHC: 33.2 g/dL (ref 31.5–35.7)
MCV: 89 fL (ref 79–97)
Platelets: 202 10*3/uL (ref 150–450)
RBC: 4.89 x10E6/uL (ref 4.14–5.80)
RDW: 13.7 % (ref 11.6–15.4)
WBC: 7 10*3/uL (ref 3.4–10.8)

## 2022-01-08 ENCOUNTER — Other Ambulatory Visit: Payer: Self-pay

## 2022-01-08 ENCOUNTER — Ambulatory Visit (INDEPENDENT_AMBULATORY_CARE_PROVIDER_SITE_OTHER): Payer: No Typology Code available for payment source

## 2022-01-08 DIAGNOSIS — I429 Cardiomyopathy, unspecified: Secondary | ICD-10-CM

## 2022-01-08 LAB — ECHOCARDIOGRAM COMPLETE
AR max vel: 3.56 cm2
AV Area VTI: 3.23 cm2
AV Area mean vel: 3.49 cm2
AV Mean grad: 5 mmHg
AV Peak grad: 8.3 mmHg
Ao pk vel: 1.44 m/s
Area-P 1/2: 3.51 cm2
Calc EF: 56 %
Single Plane A2C EF: 59 %
Single Plane A4C EF: 54.6 %

## 2022-01-14 ENCOUNTER — Telehealth (HOSPITAL_COMMUNITY): Payer: Self-pay | Admitting: Emergency Medicine

## 2022-01-14 NOTE — Telephone Encounter (Signed)
Attempted to call patient regarding upcoming cardiac CT appointment. °Left message on voicemail with name and callback number °Caryssa Elzey RN Navigator Cardiac Imaging °[facility] °[phone] Office °[phone] Cell ° °

## 2022-01-14 NOTE — Telephone Encounter (Signed)
Reaching out to patient to offer assistance regarding upcoming cardiac imaging study; pt verbalizes understanding of appt date/time, parking situation and where to check in, pre-test NPO status and medications ordered, and verified current allergies; name and call back number provided for further questions should they arise ?Marchia Bond RN Navigator Cardiac Imaging ?[facility] ?[phone] office ?[phone] cell ? ?Arrival 845 ?Denies iv issues ?Taking metoprolol 2 hr prior  ?

## 2022-01-15 ENCOUNTER — Other Ambulatory Visit: Payer: Self-pay

## 2022-01-15 ENCOUNTER — Ambulatory Visit
Admission: RE | Admit: 2022-01-15 | Discharge: 2022-01-15 | Disposition: A | Discharge status: Home / Self Care | Payer: No Typology Code available for payment source | Source: Ambulatory Visit | Attending: Medical | Admitting: Medical

## 2022-01-15 DIAGNOSIS — R072 Precordial pain: Secondary | ICD-10-CM

## 2022-01-15 MED ORDER — NITROGLYCERIN 0.4 MG SL SUBL
0.8000 mg | SUBLINGUAL_TABLET | Freq: Once | SUBLINGUAL | Status: AC
  Administered 2022-01-15: 0.8 mg via SUBLINGUAL

## 2022-01-15 MED ORDER — METOPROLOL TARTRATE 5 MG/5ML IV SOLN
10.0000 mg | Freq: Once | INTRAVENOUS | Status: AC
  Administered 2022-01-15: 10 mg via INTRAVENOUS

## 2022-01-15 MED ORDER — IOHEXOL 350 MG/ML SOLN
100.0000 mL | Freq: Once | INTRAVENOUS | Status: AC | PRN
  Administered 2022-01-15: 100 mL via INTRAVENOUS

## 2022-01-15 NOTE — Progress Notes (Signed)
Patient tolerated CT well. Drank water after. Vital signs stable encourage to drink water throughout day.Reasons explained and verbalized understanding. Ambulated steady gait.  

## 2022-02-02 ENCOUNTER — Telehealth: Payer: Self-pay | Admitting: *Deleted

## 2022-02-02 NOTE — Telephone Encounter (Signed)
NO PA REQUIRED  ?REF# [id number] ?Donato Schultz REF#  [id number] ?

## 2022-02-03 NOTE — Telephone Encounter (Signed)
Noted  

## 2022-02-03 NOTE — Telephone Encounter (Signed)
This was sent to me, though this Stephen Sherman was set up by the [place] office, not [facility]. I will forward this information to the correct. I will forward to [facility] office triage as in hopes that they may get to the correct person in their office.  ?

## 2022-02-03 NOTE — Telephone Encounter (Signed)
Called the patient to make him aware that he may proceed with wearing his Itamar and provide the activation code "[id number]". Patient has a screening service for his incoming phone calls. Lmtcb. ?

## 2022-02-03 NOTE — Telephone Encounter (Signed)
Patient returning call .  Given Activation code for Itamar device and is aware no PA required .   ? ? ?Patient states he has not received in mail yet but agrees to call in a few days if still has not received.  ?

## 2022-02-04 NOTE — Telephone Encounter (Signed)
Called pt to let him know that he can pick up a WatchPAT in the office. No answer, left detailed message per DPR. ? ?Will place WatchPat up front for pt to pick up.  ?

## 2022-02-10 ENCOUNTER — Telehealth: Payer: Self-pay | Admitting: Medical

## 2022-02-10 NOTE — Telephone Encounter (Signed)
Patient states he wore his WatchPat last night and after getting everything set up the app said the serial number had not been authorized. Please call to discuss.

## 2022-02-10 NOTE — Telephone Encounter (Signed)
Checked the status on the [employer] ?Patients watchPAT was registered but the serial number was not entered. ?  ?Called and spoke with the patient. Pt sts that he has not completed wearing the device due to the message he received when attempting to get in to the app. ?He currently driving and on his way to work. ? ?Patient will send a mychart message with the device serial number tomorrow so that it can be added to the [facility] registration on the website. He will then be able to proceed with wearing the device. ?

## 2022-02-11 NOTE — Telephone Encounter (Signed)
See MyChart message from today. Pt gave serial number for WatchPat device. WatchPat has been registered and pt notified that he may initiate home sleep study and let us know if he has any further issues.  ?

## 2022-02-13 ENCOUNTER — Encounter (INDEPENDENT_AMBULATORY_CARE_PROVIDER_SITE_OTHER): Payer: No Typology Code available for payment source | Admitting: Cardiology

## 2022-02-13 DIAGNOSIS — G4733 Obstructive sleep apnea (adult) (pediatric): Secondary | ICD-10-CM | POA: Diagnosis not present

## 2022-02-16 ENCOUNTER — Ambulatory Visit: Payer: No Typology Code available for payment source

## 2022-02-16 DIAGNOSIS — G479 Sleep disorder, unspecified: Secondary | ICD-10-CM

## 2022-02-16 NOTE — Procedures (Signed)
? ?  SLEEP STUDY REPORT ?Patient Information ?Study Date: 02/13/22 ?Patient Name: Stephen Sherman ?Patient ID: [id number] ?Birth Date: [birth year] ?Age: 66 ?Gender: Male ?BMI: 37.6 (W=278 lb, H=6' 0'') ?Referring Physician:Cadence Kathlen Mody, PA ? ?TEST DESCRIPTION: Home sleep apnea testing was completed using the WatchPat, a Type 1 device, utilizing ?peripheral arterial tonometry (PAT), chest movement, actigraphy, pulse oximetry, pulse rate, body position and snore. ?AHI was calculated with apnea and hypopnea using valid sleep time as the denominator. RDI includes apneas, ?hypopneas, and RERAs. The data acquired and the scoring of sleep and all associated events were performed in ?accordance with the recommended standards and specifications as outlined in the AASM Manual for the Scoring of ?Sleep and Associated Events 2.2.0 (2015). ? ?FINDINGS: ?1. Severe Obstructive Sleep Apnea with AHI 44.8/hr. ?2. Minimal Central Sleep Apnea with pAHIc 5.7/hr. ?3. Oxygen desaturations as low as 73%. ?4. Severe snoring was present. O2 sats were < 88% for 82.8 min. ?5. Total sleep time was 7 hrs and 25 min. ?6. 23.5% of total sleep time was spent in REM sleep. ?7. Shortened sleep onset latency at 6 min. ?8. Prolonged REM sleep onset latency at 152 min. ?9. Total awakenings were 6. ? ?DIAGNOSIS: ?Severe Obstructive Sleep Apnea (G47.33) ?Nocturnal Hypoxemia ? ?RECOMMENDATIONS: ?1. Clinical correlation of these findings is necessary. The decision to treat obstructive sleep apnea (OSA) is usually ?based on the presence of apnea symptoms or the presence of associated medical conditions such as Hypertension, ?Congestive Heart Failure, Atrial Fibrillation or Obesity. The most common symptoms of OSA are snoring, gasping for ?breath while sleeping, daytime sleepiness and fatigue. ? ?2. Initiating apnea therapy is recommended given the presence of symptoms and/or associated conditions. ?Recommend proceeding with one of the following: ? ? a. Auto-CPAP  therapy with a pressure range of 5-20cm H2O. ? ? b. An oral appliance (OA) that can be obtained from certain dentists with expertise in sleep medicine. These are ?primarily of use in non-obese patients with mild and moderate disease. ? ? c. An ENT consultation which may be useful to look for specific causes of obstruction and possible treatment ?options. ? ? d. If patient is intolerant to PAP therapy, consider referral to ENT for evaluation for hypoglossal nerve stimulator. ? ?3. Close follow-up is necessary to ensure success with CPAP or oral appliance therapy for maximum benefit . ? ?4. A follow-up oximetry study on CPAP is recommended to assess the adequacy of therapy and determine the need ?for supplemental oxygen or the potential need for Bi-level therapy. An arterial blood gas to determine the adequacy of ?baseline ventilation and oxygenation should also be considered. ? ?5. Healthy sleep recommendations include: adequate nightly sleep (normal 7-9 hrs/night), avoidance of caffeine after ?noon and alcohol near bedtime, and maintaining a sleep environment that is cool, dark and quiet. ? ?6. Weight loss for overweight patients is recommended. Even modest amounts of weight loss can significantly ?improve the severity of sleep apnea. ? ?7. Snoring recommendations include: weight loss where appropriate, side sleeping, and avoidance of alcohol before ?bed. ? ?8. Operation of motor vehicle should be avoided when sleepy. ? ?Signature: ?Electronically Signed: 02/16/22 ? ?

## 2022-02-17 ENCOUNTER — Telehealth: Payer: Self-pay | Admitting: *Deleted

## 2022-02-17 DIAGNOSIS — I5022 Chronic systolic (congestive) heart failure: Secondary | ICD-10-CM

## 2022-02-17 DIAGNOSIS — G4733 Obstructive sleep apnea (adult) (pediatric): Secondary | ICD-10-CM

## 2022-02-17 NOTE — Telephone Encounter (Signed)
-----   Message from Lauralee Evener, [place] sent at 02/17/2022  8:48 AM EDT ----- ? ?----- Message ----- ?From: Sueanne Margarita, MD ?Sent: 02/16/2022   2:05 PM EDT ?To: Cv Div Sleep Studies ? ?Please let patient know that they have sleep apnea.  Recommend therapeutic CPAP titration ASAP for treatment of patient's sleep disordered breathing.  If unable to perform an in lab titration then initiate ResMed auto CPAP from 4 to 15cm H2O with heated humidity and mask of choice and overnight pulse ox on CPAP.    ? ?

## 2022-02-17 NOTE — Telephone Encounter (Signed)
The patient has been notified of the result and verbalized understanding.  All questions (if any) were answered. ?Stephen Sherman, [place] 02/17/2022 11:43 PM   ?Titration ready to precert ?

## 2022-02-18 ENCOUNTER — Ambulatory Visit (INDEPENDENT_AMBULATORY_CARE_PROVIDER_SITE_OTHER): Payer: No Typology Code available for payment source | Admitting: Cardiology

## 2022-02-18 ENCOUNTER — Encounter: Payer: Self-pay | Admitting: Cardiology

## 2022-02-18 ENCOUNTER — Encounter: Payer: Self-pay | Admitting: *Deleted

## 2022-02-18 VITALS — BP 132/82 | HR 63 | Ht 71.5 in | Wt 281.0 lb

## 2022-02-18 DIAGNOSIS — G4733 Obstructive sleep apnea (adult) (pediatric): Secondary | ICD-10-CM | POA: Diagnosis not present

## 2022-02-18 DIAGNOSIS — R911 Solitary pulmonary nodule: Secondary | ICD-10-CM

## 2022-02-18 DIAGNOSIS — I4892 Unspecified atrial flutter: Secondary | ICD-10-CM | POA: Diagnosis not present

## 2022-02-18 DIAGNOSIS — Z01818 Encounter for other preprocedural examination: Secondary | ICD-10-CM | POA: Diagnosis not present

## 2022-02-18 DIAGNOSIS — I2510 Atherosclerotic heart disease of native coronary artery without angina pectoris: Secondary | ICD-10-CM | POA: Diagnosis not present

## 2022-02-18 NOTE — Patient Instructions (Addendum)
Medications: ?Your physician recommends that you continue on your current medications as directed. Please refer to the Current Medication list given to you today. ?*If you need a refill on your cardiac medications before your next appointment, please call your pharmacy* ? ?Lab Work: ?None. ?If you have labs (blood work) drawn today and your tests are completely normal, you will receive your results only by: ?MyChart Message (if you have MyChart) OR ?A paper copy in the mail ?If you have any lab test that is abnormal or we need to change your treatment, we will call you to review the results. ? ?Testing/Procedures: CT Tuesday 9/5 @ 9:00 am [facility]  [address] [place], [place]  ?Non-Cardiac CT scanning, (CAT scanning), is a noninvasive, special x-ray that produces cross-sectional images of the body using x-rays and a computer. CT scans help physicians diagnose and treat medical conditions. For some CT exams, a contrast material is used to enhance visibility in the area of the body being studied. CT scans provide greater clarity and reveal more details than regular x-ray exams.  ? ?Your physician has recommended that you have an ablation. Catheter ablation is a medical procedure used to treat some cardiac arrhythmias (irregular heartbeats). During catheter ablation, a long, thin, flexible tube is put into a blood vessel in your groin (upper thigh), or neck. This tube is called an ablation catheter. It is then guided to your heart through the blood vessel. Radio frequency waves destroy small areas of heart tissue where abnormal heartbeats may cause an arrhythmia to start. Please see the instruction sheet given to you today.  ? ?Follow-Up: ?At [facility], you and your health needs are our priority.  As part of our continuing mission to provide you with exceptional heart care, we have created designated Provider Care Teams.  These Care Teams include your primary Cardiologist (physician) and Advanced  Practice Providers (APPs -  Physician Assistants and Nurse Practitioners) who all work together to provide you with the care you need, when you need it. ? ?Your physician wants you to follow-up in: (ablation July 21) 4 weeks post ablation with Dr. Quentin Ore.  ? ?We recommend signing up for the patient portal called "MyChart".  Sign up information is provided on this After Visit Summary.  MyChart is used to connect with patients for Virtual Visits (Telemedicine).  Patients are able to view lab/test results, encounter notes, upcoming appointments, etc.  Non-urgent messages can be sent to your provider as well.   ?To learn more about what you can do with MyChart, go to [email or web address].   ? ?Any Other Special Instructions Will Be Listed Below (If Applicable).  ? ?Cardiac Ablation ?Cardiac ablation is a procedure to destroy (ablate) some heart tissue that is sending bad signals. These bad signals cause problems in heart rhythm. ?The heart has many areas that make these signals. If there are problems in these areas, they can make the heart beat in a way that is not normal. Destroying some tissues can help make the heart rhythm normal. ?Tell your doctor about: ?Any allergies you have. ?All medicines you are taking. These include vitamins, herbs, eye drops, creams, and over-the-counter medicines. ?Any problems you or family members have had with medicines that make you fall asleep (anesthetics). ?Any blood disorders you have. ?Any surgeries you have had. ?Any medical conditions you have, such as kidney failure. ?Whether you are pregnant or may be pregnant. ?What are the risks? ?This is a safe procedure. But problems may  occur, including: ?Infection. ?Bruising and bleeding. ?Bleeding into the chest. ?Stroke or blood clots. ?Damage to nearby areas of your body. ?Allergies to medicines or dyes. ?The need for a pacemaker if the normal system is damaged. ?Failure of the procedure to treat the problem. ?What happens before  the procedure? ?Medicines ?Ask your doctor about: ?Changing or stopping your normal medicines. This is important. ?Taking aspirin and ibuprofen. Do not take these medicines unless your doctor tells you to take them. ?Taking other medicines, vitamins, herbs, and supplements. ?General instructions ?Follow instructions from your doctor about what you cannot eat or drink. ?Plan to have someone take you home from the hospital or clinic. ?If you will be going home right after the procedure, plan to have someone with you for 24 hours. ?Ask your doctor what steps will be taken to prevent infection. ?What happens during the procedure? ? ?An IV tube will be put into one of your veins. ?You will be given a medicine to help you relax. ?The skin on your neck or groin will be numbed. ?A cut (incision) will be made in your neck or groin. A needle will be put through your cut and into a large vein. ?A tube (catheter) will be put into the needle. The tube will be moved to your heart. ?Dye may be put through the tube. This helps your doctor see your heart. ?Small devices (electrodes) on the tube will send out signals. ?A type of energy will be used to destroy some heart tissue. ?The tube will be taken out. ?Pressure will be held on your cut. This helps stop bleeding. ?A bandage will be put over your cut. ?The exact procedure may vary among doctors and hospitals. ?What happens after the procedure? ?You will be watched until you leave the hospital or clinic. This includes checking your heart rate, breathing rate, oxygen, and blood pressure. ?Your cut will be watched for bleeding. You will need to lie still for a few hours. ?Do not drive for 24 hours or as long as your doctor tells you. ?Summary ?Cardiac ablation is a procedure to destroy some heart tissue. This is done to treat heart rhythm problems. ?Tell your doctor about any medical conditions you may have. Tell him or her about all medicines you are taking to treat them. ?This is a  safe procedure. But problems may occur. These include infection, bruising, bleeding, and damage to nearby areas of your body. ?Follow what your doctor tells you about food and drink. You may also be told to change or stop some of your medicines. ?After the procedure, do not drive for 24 hours or as long as your doctor tells you. ?This information is not intended to replace advice given to you by your health care provider. Make sure you discuss any questions you have with your health care provider. ?Document Revised: 10/05/2019 Document Reviewed: 10/05/2019 ?Elsevier Patient Education ? [place]. ? ?

## 2022-02-18 NOTE — Progress Notes (Signed)
?Electrophysiology Office Note:   ? ?Date:  02/18/2022  ? ?ID:  Stephen Sherman, DOB 1956-07-24, MRN [id number] ? ?PCP:  Stephen Billet, MD  ?[facility] Cardiologist:  Nelva Bush, MD  ?[facility] Electrophysiologist:  None  ? ?Referring MD: Antony Madura, PA-C  ? ?Chief Complaint: Atrial flutter ? ?History of Present Illness:   ? ?Stephen Sherman is a 66 y.o. male who presents for an evaluation of atrial flutter at the request of Dr. Garen Sherman and Stephen Sherman. Their medical history includes hyperlipidemia, alcohol use, sleep apnea, prostate cancer, CAD.  The patient was admitted to the hospital in February 2023 with atrial flutter.  He had had this in the past and was followed at [facility].  He had a cardioversion in January 2021.  Flecainide was provided for a pill in the pocket approach.  He had a CHA2DS2-VASc of 1 and thus was not anticoagulated.  Previously was referred for sleep study.  In February 2023, during his admission, his ejection fraction was mildly decreased with an EF of 45%.  He underwent a cardioversion and was discharged on Toprol and flecainide in addition to Eliquis.  He saw Cadence in follow-up on February 21 and was doing well. ? ?Today he is doing okay.  He is in normal rhythm today.  He tells me that he knew something was wrong when he was in atrial fibrillation in February.  He put on his watch and it confirmed an elevated heart rate consistent with atrial flutter.  He has a lot of questions today about his medical record and results from CT scans and echo. ? ? ?  ?Past Medical History:  ?Diagnosis Date  ? Atrial flutter ([place])   ? CAD (coronary artery disease)   ? Cancer [facility])   ? Prostate  ? HLD (hyperlipidemia)   ? ? ?Past Surgical History:  ?Procedure Laterality Date  ? CARDIOVERSION N/A 12/30/2021  ? Procedure: CARDIOVERSION;  Surgeon: Minna Merritts, MD;  Location: [facility] ORS;  Service: Cardiovascular;  Laterality: N/A;  ? COLONOSCOPY  2012  ? GUM SURGERY  [phone]  ? HERNIA REPAIR  Bilateral 2012  ? INGUNIAL  ? ORIF ANKLE FRACTURE Left 08/18/2017  ? Procedure: OPEN REDUCTION INTERNAL FIXATION (ORIF) ANKLE FRACTURE;  Surgeon: Earnestine Leys, MD;  Location: [facility] ORS;  Service: Orthopedics;  Laterality: Left;  ? TEE WITHOUT CARDIOVERSION N/A 12/30/2021  ? Procedure: TRANSESOPHAGEAL ECHOCARDIOGRAM (TEE);  Surgeon: Minna Merritts, MD;  Location: [facility] ORS;  Service: Cardiovascular;  Laterality: N/A;  ? ? ?Current Medications: ?Current Meds  ?Medication Sig  ? apixaban (ELIQUIS) 5 MG TABS tablet Take 1 tablet (5 mg total) by mouth 2 (two) times daily.  ? aspirin EC 81 MG EC tablet Take 1 tablet (81 mg total) by mouth daily. Swallow whole.  ? flecainide (TAMBOCOR) 150 MG tablet Take 0.5 tablets (75 mg total) by mouth every 12 (twelve) hours.  ? metoprolol succinate (TOPROL-XL) 25 MG 24 hr tablet Take 1 tablet (25 mg total) by mouth daily.  ? rosuvastatin (CRESTOR) 5 MG tablet Take 1 tablet by mouth daily.  ? tamsulosin (FLOMAX) 0.4 MG CAPS capsule Take 0.4 mg by mouth daily.  ? vitamin E 400 UNIT capsule Take 400 Units by mouth daily.  ?  ? ?Allergies:   Morphine  ? ?Social History  ? ?Socioeconomic History  ? Marital status: Single  ?  Spouse name: Not on file  ? Number of children: Not on file  ? Years of education: Not  on file  ? Highest education level: Not on file  ?Occupational History  ? Not on file  ?Tobacco Use  ? Smoking status: Former  ?  Packs/day: 1.00  ?  Years: 28.00  ?  Pack years: 28.00  ?  Types: Cigarettes  ?  Quit date: 12/17/2003  ?  Years since quitting: 18.1  ? Smokeless tobacco: Never  ?Vaping Use  ? Vaping Use: Never used  ?Substance and Sexual Activity  ? Alcohol use: Not Currently  ?  Comment: 2+ beers a day.  ? Drug use: No  ? Sexual activity: Not on file  ?Other Topics Concern  ? Not on file  ?Social History Narrative  ? Not on file  ? ?Social Determinants of Health  ? ?Financial Resource Strain: Not on file  ?Food Insecurity: Not on file  ?Transportation Needs: Not on  file  ?Physical Activity: Not on file  ?Stress: Not on file  ?Social Connections: Not on file  ?  ? ?Family History: ?The patient's family history includes Atrial fibrillation in his brother, sister, and sister; Diabetes in his mother. ? ?ROS:   ?Please see the history of present illness.    ?All other systems reviewed and are negative. ? ?EKGs/Labs/Other Studies Reviewed:   ? ?The following studies were reviewed today: ? ?January 15, 2022 CTA coronary ?IMPRESSION: ?1. Coronary calcium score of 357. This was 80th percentile for age ?and sex matched control. ?2. Normal coronary origin with right dominance. ?3. Calcified plaque causing mild stenosis in the RCA and LAD ?4. CAD-RADS 2. Mild non-obstructive CAD (25-49%). Consider ?non-atherosclerotic causes of chest pain. ?5. Consider preventive therapy and risk factor modification. ?6. Image quality degraded by obesity related artifacts. ? ?January 08, 2022 echo ?EF 55 to 60% ?Right ventricular function normal ?Mildly dilated left and right atrium ?No significant valvular abnormalities ? ?EKG on December 27, 2021 appears to be a coarse atrial flutter with variable AV conduction ? ?Telemetry strips from his February admission reviewed and appear most consistent with atrial flutter with variable AV conduction ? ?EKG:  The ekg ordered today demonstrates sinus rhythm.  PR 192 ms.  QRS duration 100 ms.  QTc 403 ms. ? ? ?Recent Labs: ?12/27/2021: ALT 80 ?12/29/2021: Magnesium 2.3 ?01/06/2022: BUN 20; Creatinine, Ser 0.73; Hemoglobin 14.4; Platelets 202; Potassium 4.4; Sodium 142  ?Recent Lipid Panel ?   ?Component Value Date/Time  ? CHOL 204 (H) 12/28/2021 0236  ? TRIG 185 (H) 12/28/2021 0236  ? HDL 39 (L) 12/28/2021 0236  ? CHOLHDL 5.2 12/28/2021 0236  ? VLDL 37 12/28/2021 0236  ? [place] 128 (H) 12/28/2021 0236  ? ? ?Physical Exam:   ? ?VS:  BP 132/82 (BP Location: Left Arm, Patient Position: Sitting, Cuff Size: Normal)   Pulse 63   Ht 5' 11.5" (1.816 m)   Wt 281 lb (127.5  kg)   SpO2 94%   BMI 38.65 kg/m?    ? ?Wt Readings from Last 3 Encounters:  ?02/18/22 281 lb (127.5 kg)  ?01/06/22 277 lb (125.6 kg)  ?12/27/21 283 lb 4.7 oz (128.5 kg)  ?  ? ?GEN:  Well nourished, well developed in no acute distress.  Obese ?HEENT: Normal ?NECK: No JVD; No carotid bruits ?LYMPHATICS: No lymphadenopathy ?CARDIAC: RRR, no murmurs, rubs, gallops ?RESPIRATORY:  Clear to auscultation without rales, wheezing or rhonchi  ?ABDOMEN: Soft, non-tender, non-distended ?MUSCULOSKELETAL:  No edema; No deformity  ?SKIN: Warm and dry ?NEUROLOGIC:  Alert and oriented x 3 ?PSYCHIATRIC:  Normal affect  ? ? ?  ? ?  ASSESSMENT:   ? ?1. Atrial flutter, unspecified type ([place])   ?2. Atrial flutter with rapid ventricular response ([place])   ?3. Coronary artery disease involving native coronary artery of native heart without angina pectoris   ?4. Obstructive sleep apnea   ? ?PLAN:   ? ?In order of problems listed above: ? ?#Atrial flutter, typical ?Symptomatic.  Rapid ventricular rates with left ventricular dysfunction while in atrial flutter.  I think a rhythm control strategy is indicated for him.  We discussed the options including alternative antiarrhythmic drugs and catheter ablation or today's procedure.  Given that he has isolated atrial flutter no evidence of atrial fibrillation thus far, I would recommend that we start with a catheter ablation.  I discussed the procedure in detail with the patient during today's visit include the risk, recovery and likelihood of success.  I discussed the possibility that he would go on and develop atrial fibrillation after successful atrial flutter ablation. ? ?We will proceed with scheduling. ? ?He will continue flecainide until the ablation.  After the ablation, we will stop flecainide.  He will continue Eliquis.  I discussed the risks of using long-term flecainide in the presence of coronary artery disease during today's visit. ? 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  He has received propofol and anesthesia after that episode and done well. ? ?Risk, benefits, and alternatives to EP study and radiofrequency ablation for afib were also discus

## 2022-02-25 NOTE — Telephone Encounter (Signed)
Prior Authorization for Titration sent to [employer] via web portal. Sent to clinical review. ?

## 2022-02-25 NOTE — Telephone Encounter (Signed)
Prior Authorization for TITRATION sent to [facility] via web portal. ? ?APPROVED-180 DAYS EXPIRES  -08/24/2022  AUTH # [id number] ? ?

## 2022-02-26 ENCOUNTER — Encounter: Payer: Self-pay | Admitting: Physician Assistant

## 2022-02-26 ENCOUNTER — Ambulatory Visit (INDEPENDENT_AMBULATORY_CARE_PROVIDER_SITE_OTHER): Payer: No Typology Code available for payment source | Admitting: Physician Assistant

## 2022-02-26 VITALS — BP 134/84 | HR 64 | Ht 72.0 in | Wt 282.0 lb

## 2022-02-26 DIAGNOSIS — E785 Hyperlipidemia, unspecified: Secondary | ICD-10-CM

## 2022-02-26 DIAGNOSIS — I2510 Atherosclerotic heart disease of native coronary artery without angina pectoris: Secondary | ICD-10-CM | POA: Diagnosis not present

## 2022-02-26 DIAGNOSIS — G4733 Obstructive sleep apnea (adult) (pediatric): Secondary | ICD-10-CM

## 2022-02-26 DIAGNOSIS — R911 Solitary pulmonary nodule: Secondary | ICD-10-CM

## 2022-02-26 DIAGNOSIS — I4892 Unspecified atrial flutter: Secondary | ICD-10-CM | POA: Diagnosis not present

## 2022-02-26 MED ORDER — APIXABAN 5 MG PO TABS: 5.0000 mg | ORAL_TABLET | Freq: Two times a day (BID) | ORAL | 3 refills | Status: AC

## 2022-02-26 MED ORDER — METOPROLOL SUCCINATE ER 25 MG PO TB24: 25.0000 mg | ORAL_TABLET | Freq: Every day | ORAL | 3 refills | Status: AC

## 2022-02-26 NOTE — Progress Notes (Signed)
? ?Cardiology Office Note   ? ?Date:  02/26/2022  ? ?ID:  Stephen Sherman, DOB Sep 13, 1956, MRN [id number] ? ?PCP:  [name], MD  ?Cardiologist:  Nelva Bush, MD  ?Electrophysiologist:  None  ? ?Chief Complaint: Follow-up ? ?History of Present Illness:  ? ?Stephen Sherman is a 66 y.o. male with history of nonobstructive CAD by coronary CTA, atrial flutter, Guillain-Barr? syndrome in 2019 with full recovery, HLD, alcohol use, sleep apnea, and prostate cancer who presents for follow-up of coronary CTA. ? ?He was previously followed by Dr. Naida Sleight at [facility], last seeing them in 03/2020.  Notes indicate he previously underwent successful TEE guided DCCV in 11/2019.  Echo at that time demonstrated an EF greater than 55% with moderate left atrial dilatation.  He had recurrent atrial flutter 1 month later and was started on flecainide pill in the pocket.  Historically, he had not been on anticoagulation given a CHA2DS2-VASc of 1.  He was subsequently admitted to the hospital in 12/2021 with atrial flutter with RVR and initiated on Cardizem and heparin drips.  Flecainide was titrated.  Echo showed an EF of 45 to 50%.  He underwent successful TEE guided DCCV during that admission with TEE demonstrating an EF of 55%.  He was noted to have mildly elevated high-sensitivity troponin during the admission.  He was discharged on Toprol, flecainide, and apixaban.  He was maintaining sinus rhythm in hospital follow-up in 12/2021.  He underwent repeat echo in 12/2021 which demonstrated an EF of 55 to 60%, normal LV diastolic function parameters, normal RV systolic function with mildly enlarged RV cavity size, mild biatrial enlargement, and no significant valvular abnormalities.  Given mildly elevated high-sensitivity troponin, he underwent coronary CTA in 01/2022 which demonstrated a calcium score of 357 which was the 80th percentile.  There was mild nonobstructive proximal and mid LAD stenosis estimated at 25 to 49%.  Noncardiac over read did  demonstrate a pleural-based 5 mm nodule in the left lower lobe of the lung with no recommendation for follow-up if patient was low risk.  He underwent a sleep study which confirmed sleep apnea.  He was evaluated by EP earlier this month for atrial flutter and was maintaining sinus rhythm. ? ?He has been evaluated by EP earlier this month for atrial flutter and is currently scheduled for atrial flutter ablation on 06/05/2022.   ? ?He comes in doing well from a cardiac perspective and is without symptoms of angina or decompensation.  He is tolerating apixaban without symptoms concerning for bleeding.  No falls.  No symptoms concerning for recurrence of atrial flutter.  He has not yet received his CPAP.  He requests a prescription to purchase a CPAP through [employer].  He does report arthralgias associated with rosuvastatin, and takes this medication approximately 50% of the time. ? ? ?Labs independently reviewed: ?12/2021 - BUN 20, serum creatinine 0.73, potassium 4.4, Hgb 14.4, PLT 202, magnesium 2.3, TC 204, TG 185, HDL 39, LDL 128, A1c 5.8, albumin 3.9, AST 47, ALT 80 ?11/2019 - TSH normal ? ?Past Medical History:  ?Diagnosis Date  ? Atrial flutter ([place])   ? CAD (coronary artery disease)   ? Cancer [facility])   ? Prostate  ? HLD (hyperlipidemia)   ? ? ?Past Surgical History:  ?Procedure Laterality Date  ? CARDIOVERSION N/A 12/30/2021  ? Procedure: CARDIOVERSION;  Surgeon: Minna Merritts, MD;  Location: [facility] ORS;  Service: Cardiovascular;  Laterality: N/A;  ? COLONOSCOPY  2012  ? GUM SURGERY  [phone]  ? HERNIA REPAIR Bilateral 2012  ? INGUNIAL  ? ORIF ANKLE FRACTURE Left 08/18/2017  ? Procedure: OPEN REDUCTION INTERNAL FIXATION (ORIF) ANKLE FRACTURE;  Surgeon: Earnestine Leys, MD;  Location: [facility] ORS;  Service: Orthopedics;  Laterality: Left;  ? TEE WITHOUT CARDIOVERSION N/A 12/30/2021  ? Procedure: TRANSESOPHAGEAL ECHOCARDIOGRAM (TEE);  Surgeon: Minna Merritts, MD;  Location: [facility] ORS;  Service: Cardiovascular;   Laterality: N/A;  ? ? ?Current Medications: ?Current Meds  ?Medication Sig  ? aspirin EC 81 MG EC tablet Take 1 tablet (81 mg total) by mouth daily. Swallow whole.  ? flecainide (TAMBOCOR) 150 MG tablet Take 0.5 tablets (75 mg total) by mouth every 12 (twelve) hours.  ? rosuvastatin (CRESTOR) 5 MG tablet Take 1 tablet by mouth daily.  ? tamsulosin (FLOMAX) 0.4 MG CAPS capsule Take 0.4 mg by mouth daily.  ? vitamin E 400 UNIT capsule Take 400 Units by mouth daily.  ? [DISCONTINUED] apixaban (ELIQUIS) 5 MG TABS tablet Take 1 tablet (5 mg total) by mouth 2 (two) times daily.  ? [DISCONTINUED] metoprolol succinate (TOPROL-XL) 25 MG 24 hr tablet Take 1 tablet (25 mg total) by mouth daily.  ? ? ?Allergies:   Morphine  ? ?Social History  ? ?Socioeconomic History  ? Marital status: Single  ?  Spouse name: Not on file  ? Number of children: Not on file  ? Years of education: Not on file  ? Highest education level: Not on file  ?Occupational History  ? Not on file  ?Tobacco Use  ? Smoking status: Former  ?  Packs/day: 1.00  ?  Years: 28.00  ?  Pack years: 28.00  ?  Types: Cigarettes  ?  Quit date: 12/17/2003  ?  Years since quitting: 18.2  ? Smokeless tobacco: Never  ?Vaping Use  ? Vaping Use: Never used  ?Substance and Sexual Activity  ? Alcohol use: Yes  ?  Comment: 1 beer per day  ? Drug use: No  ? Sexual activity: Not on file  ?Other Topics Concern  ? Not on file  ?Social History Narrative  ? Not on file  ? ?Social Determinants of Health  ? ?Financial Resource Strain: Not on file  ?Food Insecurity: Not on file  ?Transportation Needs: Not on file  ?Physical Activity: Not on file  ?Stress: Not on file  ?Social Connections: Not on file  ?  ? ?Family History:  ?The patient's family history includes Atrial fibrillation in his brother, sister, and sister; Diabetes in his mother. ? ?ROS:   ?12 point review of systems is negative unless otherwise noted in the HPI ? ? ?EKGs/Labs/Other Studies Reviewed:   ? ?Studies reviewed were  summarized above. The additional studies were reviewed today: ? ?Coronary CTA 01/2022: ?Cardiac overread ?FINDINGS: ?Aorta:  Normal size.  No calcifications.  No dissection. ?  ?Aortic Valve:  Trileaflet.  No calcifications. ?  ?Coronary Arteries:  Normal coronary origin.  Right dominance. ?  ?RCA is a dominant artery that gives rise to PDA and PLA. There is ?calcified plaque causing mild proximal RCA stenosis. ?  ?Left main is a large artery that gives rise to LAD and LCX arteries. ?There is no LM disease. ?  ?LAD has plaque in the proximal and mid segment causing mild stenosis ?(25-49%). ?  ?LCX is a non-dominant artery that gives rise to two obtuse marginal ?branches. There is no plaque. ?  ?Other findings: ?  ?Normal pulmonary vein drainage into the left atrium. ?  ?  Normal left atrial appendage without a thrombus. ?  ?Normal size of the pulmonary artery. ?  ?IMPRESSION: ?1. Coronary calcium score of 357. This was 80th percentile for age ?and sex matched control. ?  ?2. Normal coronary origin with right dominance. ?  ?3. Calcified plaque causing mild stenosis in the RCA and LAD ?  ?4. CAD-RADS 2. Mild non-obstructive CAD (25-49%). Consider ?non-atherosclerotic causes of chest pain. ?  ?5. Consider preventive therapy and risk factor modification. ?  ?6. Image quality degraded by obesity related artifacts. ? ? ?Noncardiac over read ?IMPRESSION: ?1. Diffuse bronchial wall thickening with mosaic attenuation in the ?lung bases, suggesting small airways disease. ?2. Pleural-based 5 mm nodule in the left posterior lower lobe, No ?follow-up needed if patient is low-risk.This recommendation follows ?the consensus statement: Guidelines for Management of Incidental ?Pulmonary Nodules Detected on CT Images: From the Fleischner Society ?2017; Radiology 2017; 275:170-017. ?3.  Aortic Atherosclerosis (ICD10-I70.0). ?__________ ? ?2D echo 01/08/2022: ?1. Left ventricular ejection fraction, by estimation, is 55 to 60%. Left   ?ventricular ejection fraction by 2D MOD biplane is 56.0 %. The left  ?ventricle has normal function. Left ventricular endocardial border not  ?optimally defined to evaluate regional  ?wall motion. Left ventricular di

## 2022-02-26 NOTE — Patient Instructions (Signed)
Medication Instructions:  ?No changes at this time.  ? ?*If you need a refill on your cardiac medications before your next appointment, please call your pharmacy* ? ? ?Lab Work: ?TSH ? ?If you have labs (blood work) drawn today and your tests are completely normal, you will receive your results only by: ?MyChart Message (if you have MyChart) OR ?A paper copy in the mail ?If you have any lab test that is abnormal or we need to change your treatment, we will call you to review the results. ? ? ?Testing/Procedures: ?None ? ? ?Follow-Up: ?At [facility], you and your health needs are our priority.  As part of our continuing mission to provide you with exceptional heart care, we have created designated Provider Care Teams.  These Care Teams include your primary Cardiologist (physician) and Advanced Practice Providers (APPs -  Physician Assistants and Nurse Practitioners) who all work together to provide you with the care you need, when you need it. ? ? ? ?Your next appointment:   ?6 month(s) ? ?The format for your next appointment:   ?In Person ? ?Provider:   ?Nelva Bush, MD or Christell Faith, PA-C ? ?Important Information About Sugar ? ? ? ? ?  ?

## 2022-02-27 LAB — TSH: TSH: 1.12 u[IU]/mL (ref 0.450–4.500)

## 2022-03-02 NOTE — Telephone Encounter (Signed)
Note from 02/17/22 at 11:43 pm was created in error. Patient was called today and given his results. Patient is agreeable to his results. ?

## 2022-03-03 ENCOUNTER — Telehealth: Payer: Self-pay | Admitting: *Deleted

## 2022-03-03 NOTE — Telephone Encounter (Signed)
Reached out to patient and he states he will do the cpap titration, he does not want to rent his cpap machine from the dme. ?

## 2022-03-03 NOTE — Telephone Encounter (Signed)
-----   Message from Sueanne Margarita, MD sent at 02/26/2022  1:10 PM EDT ----- ?Gae Bon please let patient know that we need to get a CPAP titration first because he has very severe OSA ?----- Message ----- ?From: Rise Mu, PA-C ?Sent: 02/26/2022  12:56 PM EDT ?To: Sueanne Margarita, MD ? ?Hi Dr. Radford Pax, Just an [place]. He requested a prescription to purchase a CPAP through [employer].  I advised him we cannot do this and that I would route his concerns to you. ? ?

## 2022-03-10 NOTE — Telephone Encounter (Signed)
Sueanne Margarita, MD  Freada Bergeron, CMA ?ok  ?

## 2022-04-28 ENCOUNTER — Telehealth: Payer: Self-pay

## 2022-04-28 ENCOUNTER — Encounter: Payer: Self-pay | Admitting: Internal Medicine

## 2022-04-28 MED ORDER — FLECAINIDE ACETATE 150 MG PO TABS: 75.0000 mg | ORAL_TABLET | Freq: Two times a day (BID) | ORAL | 1 refills | Status: AC

## 2022-04-28 NOTE — Telephone Encounter (Signed)
Spoke w/ pt to confirm pharmacy.   Asked him to call back if we can be of further assistance. He is appreciative of the call.

## 2022-06-05 ENCOUNTER — Ambulatory Visit (HOSPITAL_COMMUNITY): Admit: 2022-06-05 | Payer: No Typology Code available for payment source | Admitting: Cardiology

## 2022-06-05 ENCOUNTER — Encounter (HOSPITAL_COMMUNITY): Payer: Self-pay

## 2022-06-05 SURGERY — A-FLUTTER ABLATION
Anesthesia: General

## 2022-07-01 ENCOUNTER — Encounter: Payer: Self-pay | Admitting: Cardiology

## 2022-07-01 ENCOUNTER — Ambulatory Visit: Payer: No Typology Code available for payment source | Admitting: Cardiology

## 2022-07-01 NOTE — Progress Notes (Unsigned)
Electrophysiology Office Follow up Visit Note:    Date:  07/01/2022   ID:  Stephen Sherman, DOB 02-08-1956, MRN [id number]  PCP:  [name], MD  [facility] Cardiologist:  Nelva Bush, MD  [facility] Electrophysiologist:  None    Interval History:    Stephen Sherman is a 66 y.o. male who presents for a follow up visit. They were last seen in clinic February 18, 2022 for atrial flutter.  He previously been hospitalized with atrial flutter.  He has been previously seen at [facility].  Flecainide was previously used and a pill in a pocket approach.  There is no evidence of atrial fibrillation.  At her last appointment, atrial flutter ablation was recommended but this was ultimately canceled by the patient.  He has been seen by [facility] clinic.       Past Medical History:  Diagnosis Date   Atrial flutter ([place])    CAD (coronary artery disease)    Cancer ([place])    Prostate   HLD (hyperlipidemia)     Past Surgical History:  Procedure Laterality Date   CARDIOVERSION N/A 12/30/2021   Procedure: CARDIOVERSION;  Surgeon: Minna Merritts, MD;  Location: [facility] ORS;  Service: Cardiovascular;  Laterality: N/A;   COLONOSCOPY  2012   GUM SURGERY  2009,2012   HERNIA REPAIR Bilateral 2012   INGUNIAL   ORIF ANKLE FRACTURE Left 08/18/2017   Procedure: OPEN REDUCTION INTERNAL FIXATION (ORIF) ANKLE FRACTURE;  Surgeon: Earnestine Leys, MD;  Location: [facility] ORS;  Service: Orthopedics;  Laterality: Left;   TEE WITHOUT CARDIOVERSION N/A 12/30/2021   Procedure: TRANSESOPHAGEAL ECHOCARDIOGRAM (TEE);  Surgeon: Minna Merritts, MD;  Location: [facility] ORS;  Service: Cardiovascular;  Laterality: N/A;    Current Medications: No outpatient medications have been marked as taking for the 07/01/22 encounter (Appointment) with Vickie Epley, MD.     Allergies:   Morphine   Social History   Socioeconomic History   Marital status: Single    Spouse name: Not on file   Number of children: Not on file   Years of  education: Not on file   Highest education level: Not on file  Occupational History   Not on file  Tobacco Use   Smoking status: Former    Packs/day: 1.00    Years: 28.00    Total pack years: 28.00    Types: Cigarettes    Quit date: 12/17/2003    Years since quitting: 18.5   Smokeless tobacco: Never  Vaping Use   Vaping Use: Never used  Substance and Sexual Activity   Alcohol use: Yes    Comment: 1 beer per day   Drug use: No   Sexual activity: Not on file  Other Topics Concern   Not on file  Social History Narrative   Not on file   Social Determinants of Health   Financial Resource Strain: Not on file  Food Insecurity: Not on file  Transportation Needs: Not on file  Physical Activity: Not on file  Stress: Not on file  Social Connections: Not on file     Family History: The patient's family history includes Atrial fibrillation in his brother, sister, and sister; Diabetes in his mother.  ROS:   Please see the history of present illness.    All other systems reviewed and are negative.  EKGs/Labs/Other Studies Reviewed:    The following studies were reviewed today:  January 15, 2022 CTA coronary Mild stenosis in the RCA and LAD  EKG:  The  ekg ordered today demonstrates ***  Recent Labs: 12/27/2021: ALT 80 12/29/2021: Magnesium 2.3 01/06/2022: BUN 20; Creatinine, Ser 0.73; Hemoglobin 14.4; Platelets 202; Potassium 4.4; Sodium 142 02/26/2022: TSH 1.120  Recent Lipid Panel    Component Value Date/Time   CHOL 204 (H) 12/28/2021 0236   TRIG 185 (H) 12/28/2021 0236   HDL 39 (L) 12/28/2021 0236   CHOLHDL 5.2 12/28/2021 0236   VLDL 37 12/28/2021 0236   LDLCALC 128 (H) 12/28/2021 0236    Physical Exam:    VS:  There were no vitals taken for this visit.    Wt Readings from Last 3 Encounters:  02/26/22 282 lb (127.9 kg)  02/18/22 281 lb (127.5 kg)  01/06/22 277 lb (125.6 kg)     GEN: *** Well nourished, well developed in no acute distress HEENT: Normal NECK:  No JVD; No carotid bruits LYMPHATICS: No lymphadenopathy CARDIAC: ***RRR, no murmurs, rubs, gallops RESPIRATORY:  Clear to auscultation without rales, wheezing or rhonchi  ABDOMEN: Soft, non-tender, non-distended MUSCULOSKELETAL:  No edema; No deformity  SKIN: Warm and dry NEUROLOGIC:  Alert and oriented x 3 PSYCHIATRIC:  Normal affect        ASSESSMENT:    1. Atrial flutter with rapid ventricular response ([place])   2. Coronary artery disease involving native coronary artery of native heart without angina pectoris    PLAN:    In order of problems listed above:   #Atrial flutter  Flecainide versus ablation      Total time spent with patient today *** minutes. This includes reviewing records, evaluating the patient and coordinating care.   Medication Adjustments/Labs and Tests Ordered: Current medicines are reviewed at length with the patient today.  Concerns regarding medicines are outlined above.  No orders of the defined types were placed in this encounter.  No orders of the defined types were placed in this encounter.    Signed, Lars Mage, MD, [facility], [facility] 07/01/2022 5:10 AM    Electrophysiology [facility]

## 2022-07-21 ENCOUNTER — Ambulatory Visit
Admission: RE | Admit: 2022-07-21 | Discharge: 2022-07-21 | Disposition: A | Discharge status: Home / Self Care | Payer: No Typology Code available for payment source | Source: Ambulatory Visit | Attending: Cardiology | Admitting: Cardiology

## 2022-07-21 DIAGNOSIS — R911 Solitary pulmonary nodule: Secondary | ICD-10-CM | POA: Diagnosis not present

## 2023-03-23 ENCOUNTER — Other Ambulatory Visit: Payer: Self-pay | Admitting: Physician Assistant

## 2023-03-26 ENCOUNTER — Other Ambulatory Visit: Payer: Self-pay | Admitting: Internal Medicine

## 2023-04-27 ENCOUNTER — Other Ambulatory Visit: Payer: Self-pay | Admitting: Internal Medicine

## 2023-04-27 ENCOUNTER — Other Ambulatory Visit: Payer: Self-pay | Admitting: Physician Assistant

## 2024-01-18 IMAGING — CT CT HEART MORP W/ CTA COR W/ SCORE W/ CA W/CM &/OR W/O CM
1 of 15 series · 3 of 20 positions shown, 4 images · non-contrast
Comparison: None.

Addendum:
CLINICAL DATA: Chest pain

EXAM:
Cardiac/Coronary  CTA
TECHNIQUE: The patient was scanned on a Siemens Somatom go.Top scanner.

[Series 26: multiphase % cta coronary 0.60 · axial · 0.46mm/px · z∈[-1104,-1039]mm · 3 of 3936 slices shown, 4 images]
[im 984/3936  vessel]
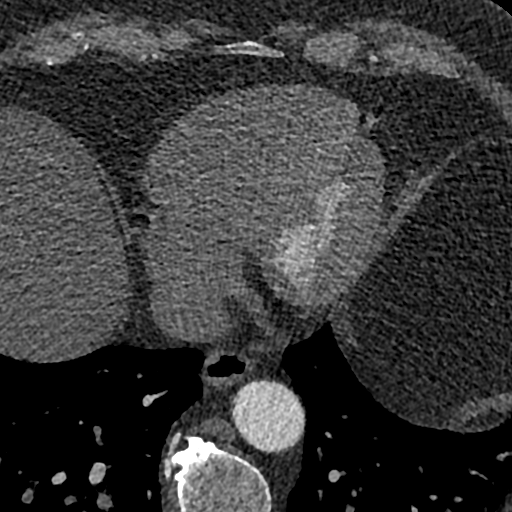
[im 984/3936  lung]
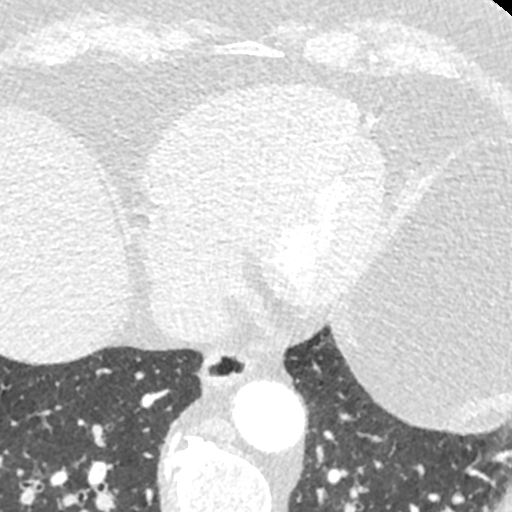
[im 1968/3936  vessel]
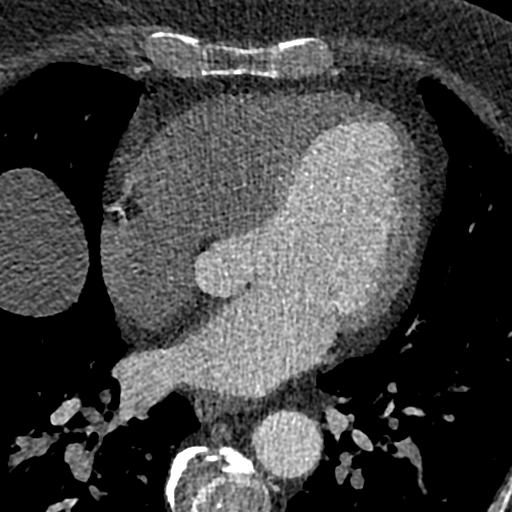
[im 2952/3936  vessel]
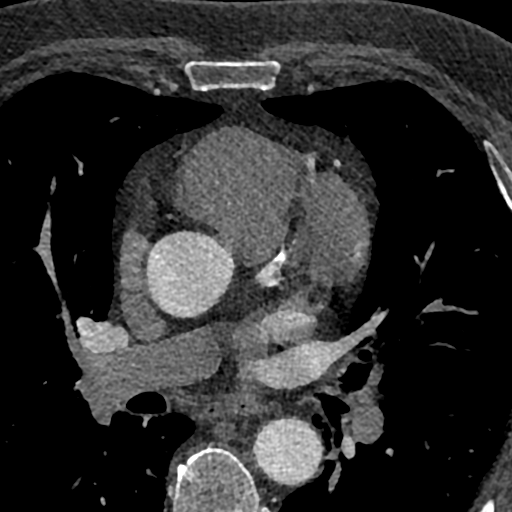

[3 of 20 positions shown; findings below may reference images not displayed]



Aortic Valve:  Trileaflet.  No calcifications.

Coronary Arteries:  Normal coronary origin.  Right dominance.

RCA is a dominant artery that gives rise to PDA and PLA. There is
calcified plaque causing mild proximal RCA stenosis.

Left main is a large artery that gives rise to LAD and LCX arteries.
There is no LM disease.

LAD has plaque in the proximal and mid segment causing mild stenosis
(25-49%).

LCX is a non-dominant artery that gives rise to two obtuse marginal
branches. There is no plaque.

Other findings:

Normal pulmonary vein drainage into the left atrium.

Normal left atrial appendage without a thrombus.

Normal size of the pulmonary artery.
IMPRESSION: 1. Coronary calcium score of 357. This was 80th percentile for age
and sex matched control.

2. Normal coronary origin with right dominance.

3. Calcified plaque causing mild stenosis in the RCA and LAD

4. CAD-RADS 2. Mild non-obstructive CAD (25-49%). Consider
non-atherosclerotic causes of chest pain.

5. Consider preventive therapy and risk factor modification.

6. Image quality degraded by obesity related artifacts.

ADDENDUM:
The following report is an over-read performed by radiologist Dr.
over-read does not include interpretation of cardiac or coronary
anatomy or pathology. The coronary calcium score/coronary CTA
interpretation by the cardiologist is attached.
FINDINGS: Vascular: Thoracic aortic atherosclerosis without aneurysmal
dilation. Normal caliber central pulmonary arteries.

Mediastinum/Nodes: No pathologically enlarged mediastinal or hilar
lymph nodes. Visualized portions of the esophagus appear normal.

Lungs/Pleura: Diffuse bronchial wall thickening with mosaic
attenuation in the lung bases. Pleural-based 5 mm nodule in the left
posterior lower lobe on image [DATE]. No pleural effusion or
pneumothorax.

Upper Abdomen: No acute abnormality.

Musculoskeletal: Multilevel degenerative changes spine. No acute
osseous abnormality.
IMPRESSION: 1. Diffuse bronchial wall thickening with mosaic attenuation in the
lung bases, suggesting small airways disease.
2. Pleural-based 5 mm nodule in the left posterior lower lobe, No
follow-up needed if patient is low-risk.This recommendation follows
the consensus statement: Guidelines for Management of Incidental
Pulmonary Nodules Detected on CT Images: From the [HOSPITAL]
3.  Aortic Atherosclerosis ([id number]).



Aortic Valve:  Trileaflet.  No calcifications.

Coronary Arteries:  Normal coronary origin.  Right dominance.

RCA is a dominant artery that gives rise to PDA and PLA. There is
calcified plaque causing mild proximal RCA stenosis.

Left main is a large artery that gives rise to LAD and LCX arteries.
There is no LM disease.

LAD has plaque in the proximal and mid segment causing mild stenosis
(25-49%).

LCX is a non-dominant artery that gives rise to two obtuse marginal
branches. There is no plaque.

Other findings:

Normal pulmonary vein drainage into the left atrium.

Normal left atrial appendage without a thrombus.

Normal size of the pulmonary artery.
IMPRESSION: 1. Coronary calcium score of 357. This was 80th percentile for age
and sex matched control.

2. Normal coronary origin with right dominance.

3. Calcified plaque causing mild stenosis in the RCA and LAD

4. CAD-RADS 2. Mild non-obstructive CAD (25-49%). Consider
non-atherosclerotic causes of chest pain.

5. Consider preventive therapy and risk factor modification.

6. Image quality degraded by obesity related artifacts.
